# Patient Record
Sex: Female | Born: 2005 | Hispanic: No | Marital: Single | Smoking: Never smoker
Health system: Southern US, Community
[De-identification: ages and names within clinical notes are randomized; demographics above are authoritative.]

## PROBLEM LIST (undated history)

## (undated) ENCOUNTER — Inpatient Hospital Stay (HOSPITAL_COMMUNITY): Payer: Self-pay

## (undated) DIAGNOSIS — Z789 Other specified health status: Secondary | ICD-10-CM

## (undated) HISTORY — PX: NO PAST SURGERIES: SHX2092

## (undated) HISTORY — DX: Other specified health status: Z78.9

---
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
## 2023-03-24 DIAGNOSIS — F419 Anxiety disorder, unspecified: Secondary | ICD-10-CM | POA: Diagnosis not present

## 2023-04-16 DIAGNOSIS — F4310 Post-traumatic stress disorder, unspecified: Secondary | ICD-10-CM | POA: Diagnosis not present

## 2023-04-22 ENCOUNTER — Other Ambulatory Visit: Payer: Self-pay | Admitting: Adult Health

## 2023-04-22 ENCOUNTER — Encounter: Payer: Self-pay | Admitting: *Deleted

## 2023-04-22 ENCOUNTER — Ambulatory Visit (INDEPENDENT_AMBULATORY_CARE_PROVIDER_SITE_OTHER): Payer: Medicaid Other | Admitting: *Deleted

## 2023-04-22 VITALS — BP 129/76 | HR 92 | Ht 67.0 in | Wt 154.5 lb

## 2023-04-22 DIAGNOSIS — Z3201 Encounter for pregnancy test, result positive: Secondary | ICD-10-CM | POA: Diagnosis not present

## 2023-04-22 LAB — POCT URINE PREGNANCY: Preg Test, Ur: POSITIVE — AB

## 2023-04-22 MED ORDER — PRENATAL PLUS 27-1 MG PO TABS: 1.0000 | ORAL_TABLET | Freq: Every day | ORAL | 12 refills | Status: AC

## 2023-04-22 NOTE — Progress Notes (Signed)
   NURSE VISIT- PREGNANCY CONFIRMATION   SUBJECTIVE:  Zoe Carroll is a 17 y.o. G74P1001 female at [redacted]w[redacted]d by certain LMP of Patient's last menstrual period was 03/20/2023. Here for pregnancy confirmation.  Home pregnancy test: positive x 2.   She reports cramping.  She is not taking prenatal vitamins.    OBJECTIVE:  BP 129/76 (BP Location: Left Arm, Patient Position: Sitting, Cuff Size: Normal)   Pulse 92   Ht 5\' 7"  (1.702 m)   Wt 154 lb 8 oz (70.1 kg)   LMP 03/20/2023   Breastfeeding Yes   BMI 24.20 kg/m   Appears well, in no apparent distress  Results for orders placed or performed in visit on 04/22/23 (from the past 24 hours)  POCT urine pregnancy   Collection Time: 04/22/23  3:52 PM  Result Value Ref Range   Preg Test, Ur Positive (A) Negative    ASSESSMENT: Positive pregnancy test, [redacted]w[redacted]d by LMP    PLAN: Schedule for dating ultrasound in 4 weeks Prenatal vitamins: note routed to [facility] to send prescription   Nausea medicines: not currently needed   OB packet given: Yes  [name]  04/22/2023 3:57 PM

## 2023-04-22 NOTE — Progress Notes (Signed)
Rx PNV ?

## 2023-04-23 ENCOUNTER — Encounter: Payer: Self-pay | Admitting: Advanced Practice Midwife

## 2023-04-23 DIAGNOSIS — F419 Anxiety disorder, unspecified: Secondary | ICD-10-CM | POA: Diagnosis not present

## 2023-05-02 DIAGNOSIS — F4310 Post-traumatic stress disorder, unspecified: Secondary | ICD-10-CM | POA: Diagnosis not present

## 2023-05-07 NOTE — L&D Delivery Note (Signed)
 OB/GYN Faculty Practice Delivery Note  Zoe Carroll is a 18 y.o. G2P1001 s/p SVD at 107w6d. She was admitted for SOL.   ROM: 0h 72m with clear fluid GBS Status: --Hennie (07/24 1330)    Labor Progress: Initial SVE: 8/90/-2. She then progressed to complete.   Delivery Date/Time: 12/31/2023 @ 12:53 Delivery: Called to room and patient was complete and pushing. Head delivered LOA. No nuchal cord present. Shoulder and body delivered in usual fashion. Infant with spontaneous cry, placed on mother's abdomen, dried and stimulated. Cord clamped x 2 after 1-minute delay, and cut by FOB. Cord blood drawn. Placenta delivered spontaneously with gentle cord traction. Fundus firm with massage and Pitocin . Labia, perineum, vagina, and cervix inspected inspected with 1st degree perineal laceration identified. Also had bilateral labial abrasions which were hemostatic. .  Baby Weight: pending  Placenta: Sent to L&D Complications: None Lacerations: 1st degree perineal repaired with 4-0 vicryl EBL: 200 mL Analgesia: Epidural   Infant:  APGAR (1 MIN): 8  APGAR (5 MINS): 9

## 2023-05-09 DIAGNOSIS — F419 Anxiety disorder, unspecified: Secondary | ICD-10-CM | POA: Diagnosis not present

## 2023-05-16 ENCOUNTER — Other Ambulatory Visit: Payer: Self-pay | Admitting: Obstetrics & Gynecology

## 2023-05-16 DIAGNOSIS — O3680X0 Pregnancy with inconclusive fetal viability, not applicable or unspecified: Secondary | ICD-10-CM

## 2023-05-20 ENCOUNTER — Ambulatory Visit: Payer: Medicaid Other | Admitting: *Deleted

## 2023-05-20 ENCOUNTER — Ambulatory Visit: Payer: Medicaid Other

## 2023-05-20 DIAGNOSIS — Z3A08 8 weeks gestation of pregnancy: Secondary | ICD-10-CM

## 2023-05-20 DIAGNOSIS — Z3491 Encounter for supervision of normal pregnancy, unspecified, first trimester: Secondary | ICD-10-CM

## 2023-05-20 DIAGNOSIS — O3680X0 Pregnancy with inconclusive fetal viability, not applicable or unspecified: Secondary | ICD-10-CM

## 2023-05-21 ENCOUNTER — Encounter: Payer: Self-pay | Admitting: Radiology

## 2023-05-22 DIAGNOSIS — F419 Anxiety disorder, unspecified: Secondary | ICD-10-CM | POA: Diagnosis not present

## 2023-06-02 DIAGNOSIS — F419 Anxiety disorder, unspecified: Secondary | ICD-10-CM | POA: Diagnosis not present

## 2023-06-05 ENCOUNTER — Ambulatory Visit: Payer: Medicaid Other | Admitting: *Deleted

## 2023-06-05 ENCOUNTER — Encounter: Payer: Medicaid Other | Admitting: Advanced Practice Midwife

## 2023-06-06 ENCOUNTER — Encounter: Payer: Self-pay | Admitting: Women's Health

## 2023-06-06 DIAGNOSIS — Z3490 Encounter for supervision of normal pregnancy, unspecified, unspecified trimester: Secondary | ICD-10-CM | POA: Insufficient documentation

## 2023-06-10 DIAGNOSIS — F419 Anxiety disorder, unspecified: Secondary | ICD-10-CM | POA: Diagnosis not present

## 2023-06-11 ENCOUNTER — Encounter: Payer: Self-pay | Admitting: Women's Health

## 2023-06-11 ENCOUNTER — Ambulatory Visit (INDEPENDENT_AMBULATORY_CARE_PROVIDER_SITE_OTHER): Payer: Medicaid Other | Admitting: Women's Health

## 2023-06-11 ENCOUNTER — Ambulatory Visit: Payer: Medicaid Other | Admitting: *Deleted

## 2023-06-11 VITALS — BP 122/66 | HR 72 | Wt 154.0 lb

## 2023-06-11 DIAGNOSIS — Z3A11 11 weeks gestation of pregnancy: Secondary | ICD-10-CM | POA: Diagnosis not present

## 2023-06-11 DIAGNOSIS — Z3480 Encounter for supervision of other normal pregnancy, unspecified trimester: Secondary | ICD-10-CM

## 2023-06-11 DIAGNOSIS — Z1332 Encounter for screening for maternal depression: Secondary | ICD-10-CM | POA: Diagnosis not present

## 2023-06-11 DIAGNOSIS — Z131 Encounter for screening for diabetes mellitus: Secondary | ICD-10-CM

## 2023-06-11 DIAGNOSIS — Z3481 Encounter for supervision of other normal pregnancy, first trimester: Secondary | ICD-10-CM | POA: Diagnosis not present

## 2023-06-11 MED ORDER — BLOOD PRESSURE MONITOR MISC: 0 refills | Status: DC

## 2023-06-11 NOTE — Patient Instructions (Signed)
 Zoe Carroll, thank you for choosing our office today! We appreciate the opportunity to meet your healthcare needs. You may receive a short survey by mail, e-mail, or through [employer]. If you are happy with your care we would appreciate if you could take just a few minutes to complete the survey questions. We read all of your comments and take your feedback very seriously. Thank you again for choosing our office.  [facility] [employer] [facility] Team at [facility]  [facility] at [facility] ([address] [place], [place] [address]) Entrance C, located off of [address] Free 24/7 valet parking   Nausea & Vomiting Have saltine crackers or pretzels by your bed and eat a few bites before you raise your head out of bed in the morning Eat small frequent meals throughout the day instead of large meals Drink plenty of fluids throughout the day to stay hydrated, just don't drink a lot of fluids with your meals.  This can make your stomach fill up faster making you feel sick Do not brush your teeth right after you eat Products with real ginger are good for nausea, like ginger ale and ginger hard candy Make sure it says made with real ginger! Sucking on sour candy like lemon heads is also good for nausea If your prenatal vitamins make you nauseated, take them at night so you will sleep through the nausea Sea Bands If you feel like you need medicine for the nausea & vomiting please let us  know If you are unable to keep any fluids or food down please let us  know   Constipation Drink plenty of fluid, preferably water, throughout the day Eat foods high in fiber such as fruits, vegetables, and grains Exercise, such as walking, is a good way to keep your bowels regular Drink warm fluids, especially warm prune juice, or decaf coffee Eat a 1/2 cup of real oatmeal (not instant), 1/2 cup applesauce, and 1/2-1 cup warm prune juice every day If needed, you may take Colace (docusate sodium ) stool softener  once or twice a day to help keep the stool soft.  If you still are having problems with constipation, you may take Miralax once daily as needed to help keep your bowels regular.   Home Blood Pressure Monitoring for Patients   Your provider has recommended that you check your blood pressure (BP) at least once a week at home. If you do not have a blood pressure cuff at home, one will be provided for you. Contact your provider if you have not received your monitor within 1 week.   Helpful Tips for Accurate Home Blood Pressure Checks  Don't smoke, exercise, or drink caffeine 30 minutes before checking your BP Use the restroom before checking your BP (a full bladder can raise your pressure) Relax in a comfortable upright chair Feet on the ground Left arm resting comfortably on a flat surface at the level of your heart Legs uncrossed Back supported Sit quietly and don't talk Place the cuff on your bare arm Adjust snuggly, so that only two fingertips can fit between your skin and the top of the cuff Check 2 readings separated by at least one minute Keep a log of your BP readings For a visual, please reference this diagram: http://ccnc.care/bpdiagram  Provider Name: [facility]     Phone: [phone]  Zone 1: ALL CLEAR  Continue to monitor your symptoms:  BP reading is less than 140 (top number) or less than 90 (bottom  number)  No right upper stomach pain No headaches or seeing spots No feeling nauseated or throwing up No swelling in face and hands  Zone 2: CAUTION Call your doctor's office for any of the following:  BP reading is greater than 140 (top number) or greater than 90 (bottom number)  Stomach pain under your ribs in the middle or right side Headaches or seeing spots Feeling nauseated or throwing up Swelling in face and hands  Zone 3: EMERGENCY  Seek immediate medical care if you have any of the following:  BP reading is greater than160 (top number) or greater than  110 (bottom number) Severe headaches not improving with Tylenol  Serious difficulty catching your breath Any worsening symptoms from Zone 2    First Trimester of Pregnancy The first trimester of pregnancy is from week 1 until the end of week 12 (months 1 through 3). A week after a sperm fertilizes an egg, the egg will implant on the wall of the uterus. This embryo will begin to develop into a baby. Genes from you and your partner are forming the baby. The female genes determine whether the baby is a boy or a girl. At 6-8 weeks, the eyes and face are formed, and the heartbeat can be seen on ultrasound. At the end of 12 weeks, all the baby's organs are formed.  Now that you are pregnant, you will want to do everything you can to have a healthy baby. Two of the most important things are to get good prenatal care and to follow your health care provider's instructions. Prenatal care is all the medical care you receive before the baby's birth. This care will help prevent, find, and treat any problems during the pregnancy and childbirth. BODY CHANGES Your body goes through many changes during pregnancy. The changes vary from woman to woman.  You may gain or lose a couple of pounds at first. You may feel sick to your stomach (nauseous) and throw up (vomit). If the vomiting is uncontrollable, call your health care provider. You may tire easily. You may develop headaches that can be relieved by medicines approved by your health care provider. You may urinate more often. Painful urination may mean you have a bladder infection. You may develop heartburn as a result of your pregnancy. You may develop constipation because certain hormones are causing the muscles that push waste through your intestines to slow down. You may develop hemorrhoids or swollen, bulging veins (varicose veins). Your breasts may begin to grow larger and become tender. Your nipples may stick out more, and the tissue that surrounds them  (areola) may become darker. Your gums may bleed and may be sensitive to brushing and flossing. Dark spots or blotches (chloasma, mask of pregnancy) may develop on your face. This will likely fade after the baby is born. Your menstrual periods will stop. You may have a loss of appetite. You may develop cravings for certain kinds of food. You may have changes in your emotions from day to day, such as being excited to be pregnant or being concerned that something may go wrong with the pregnancy and baby. You may have more vivid and strange dreams. You may have changes in your hair. These can include thickening of your hair, rapid growth, and changes in texture. Some women also have hair loss during or after pregnancy, or hair that feels dry or thin. Your hair will most likely return to normal after your baby is born. WHAT TO EXPECT AT YOUR PRENATAL  VISITS During a routine prenatal visit: You will be weighed to make sure you and the baby are growing normally. Your blood pressure will be taken. Your abdomen will be measured to track your baby's growth. The fetal heartbeat will be listened to starting around week 10 or 12 of your pregnancy. Test results from any previous visits will be discussed. Your health care provider may ask you: How you are feeling. If you are feeling the baby move. If you have had any abnormal symptoms, such as leaking fluid, bleeding, severe headaches, or abdominal cramping. If you have any questions. Other tests that may be performed during your first trimester include: Blood tests to find your blood type and to check for the presence of any previous infections. They will also be used to check for low iron levels (anemia) and Rh antibodies. Later in the pregnancy, blood tests for diabetes will be done along with other tests if problems develop. Urine tests to check for infections, diabetes, or protein in the urine. An ultrasound to confirm the proper growth and development  of the baby. An amniocentesis to check for possible genetic problems. Fetal screens for spina bifida and Down syndrome. You may need other tests to make sure you and the baby are doing well. HOME CARE INSTRUCTIONS  Medicines Follow your health care provider's instructions regarding medicine use. Specific medicines may be either safe or unsafe to take during pregnancy. Take your prenatal vitamins as directed. If you develop constipation, try taking a stool softener if your health care provider approves. Diet Eat regular, well-balanced meals. Choose a variety of foods, such as meat or vegetable-based protein, fish, milk and low-fat dairy products, vegetables, fruits, and whole grain breads and cereals. Your health care provider will help you determine the amount of weight gain that is right for you. Avoid raw meat and uncooked cheese. These carry germs that can cause birth defects in the baby. Eating four or five small meals rather than three large meals a day may help relieve nausea and vomiting. If you start to feel nauseous, eating a few soda crackers can be helpful. Drinking liquids between meals instead of during meals also seems to help nausea and vomiting. If you develop constipation, eat more high-fiber foods, such as fresh vegetables or fruit and whole grains. Drink enough fluids to keep your urine clear or pale yellow. Activity and Exercise Exercise only as directed by your health care provider. Exercising will help you: Control your weight. Stay in shape. Be prepared for labor and delivery. Experiencing pain or cramping in the lower abdomen or low back is a good sign that you should stop exercising. Check with your health care provider before continuing normal exercises. Try to avoid standing for long periods of time. Move your legs often if you must stand in one place for a long time. Avoid heavy lifting. Wear low-heeled shoes, and practice good posture. You may continue to have sex  unless your health care provider directs you otherwise. Relief of Pain or Discomfort Wear a good support bra for breast tenderness.   Take warm sitz baths to soothe any pain or discomfort caused by hemorrhoids. Use hemorrhoid cream if your health care provider approves.   Rest with your legs elevated if you have leg cramps or low back pain. If you develop varicose veins in your legs, wear support hose. Elevate your feet for 15 minutes, 3-4 times a day. Limit salt in your diet. Prenatal Care Schedule your prenatal visits by the  twelfth week of pregnancy. They are usually scheduled monthly at first, then more often in the last 2 months before delivery. Write down your questions. Take them to your prenatal visits. Keep all your prenatal visits as directed by your health care provider. Safety Wear your seat belt at all times when driving. Make a list of emergency phone numbers, including numbers for family, friends, the hospital, and police and fire departments. General Tips Ask your health care provider for a referral to a local prenatal education class. Begin classes no later than at the beginning of month 6 of your pregnancy. Ask for help if you have counseling or nutritional needs during pregnancy. Your health care provider can offer advice or refer you to specialists for help with various needs. Do not use hot tubs, steam rooms, or saunas. Do not douche or use tampons or scented sanitary pads. Do not cross your legs for long periods of time. Avoid cat litter boxes and soil used by cats. These carry germs that can cause birth defects in the baby and possibly loss of the fetus by miscarriage or stillbirth. Avoid all smoking, herbs, alcohol, and medicines not prescribed by your health care provider. Chemicals in these affect the formation and growth of the baby. Schedule a dentist appointment. At home, brush your teeth with a soft toothbrush and be gentle when you floss. SEEK MEDICAL CARE IF:   You have dizziness. You have mild pelvic cramps, pelvic pressure, or nagging pain in the abdominal area. You have persistent nausea, vomiting, or diarrhea. You have a bad smelling vaginal discharge. You have pain with urination. You notice increased swelling in your face, hands, legs, or ankles. SEEK IMMEDIATE MEDICAL CARE IF:  You have a fever. You are leaking fluid from your vagina. You have spotting or bleeding from your vagina. You have severe abdominal cramping or pain. You have rapid weight gain or loss. You vomit blood or material that looks like coffee grounds. You are exposed to German measles and have never had them. You are exposed to fifth disease or chickenpox. You develop a severe headache. You have shortness of breath. You have any kind of trauma, such as from a fall or a car accident. Document Released: 04/16/2001 Document Revised: 09/06/2013 Document Reviewed: 03/02/2013 [employer] Patient Information 2015 [place], [place]. This information is not intended to replace advice given to you by your health care provider. Make sure you discuss any questions you have with your health care provider.

## 2023-06-11 NOTE — Progress Notes (Signed)
 INITIAL OBSTETRICAL VISIT Patient name: Zoe Carroll MRN [id number]  Date of birth: Apr 11, 2006 Chief Complaint:   Initial Prenatal Visit (Spots on stomach getting bigger since pregnant)  History of Present Illness:   Zoe Carroll is a 18 y.o. G19P1001 Caucasian female at [redacted]w[redacted]d by LMP c/w u/s at 8 weeks with an Estimated Date of Delivery: 12/25/23 being seen today for her initial obstetrical visit.   Patient's last menstrual period was 03/20/2023. Her obstetrical history is significant for  term uncomplicated SVB x 1 .   Today she reports  spots on stomach getting bigger. Has had biggest one for 3 years. Don't itch.  .  Last pap <21yo. Results were: N/A     06/11/2023    3:32 PM 11/01/2021    4:23 PM  Depression screen PHQ 2/9  Decreased Interest 0 0  Down, Depressed, Hopeless 0 0  PHQ - 2 Score 0 0  Altered sleeping 0 0  Tired, decreased energy 1 1  Change in appetite 1 0  Feeling bad or failure about yourself  0 0  Trouble concentrating 0 0  Moving slowly or fidgety/restless 0 0  Suicidal thoughts 0 0  PHQ-9 Score 2 1        06/11/2023    3:32 PM 11/01/2021    4:23 PM  GAD 7 : Generalized Anxiety Score  Nervous, Anxious, on Edge 0 0  Control/stop worrying 0 0  Worry too much - different things 0 0  Trouble relaxing 0 0  Restless 0 0  Easily annoyed or irritable 0 0  Afraid - awful might happen 0 0  Total GAD 7 Score 0 0     Review of Systems:   Pertinent items are noted in HPI Denies cramping/contractions, leakage of fluid, vaginal bleeding, abnormal vaginal discharge w/ itching/odor/irritation, headaches, visual changes, shortness of breath, chest pain, abdominal pain, severe nausea/vomiting, or problems with urination or bowel movements unless otherwise stated above.  Pertinent History Reviewed:  Reviewed past medical,surgical, social, obstetrical and family history.  Reviewed problem list, medications and allergies. OB History  Gravida Para Term Preterm AB  Living  2 1 1   1   SAB IAB Ectopic Multiple Live Births     0 1    # Outcome Date GA Lbr Len/2nd Weight Sex Type Anes PTL Lv  2 Current           1 Term 12/01/21 [redacted]w[redacted]d 20:55 / 01:20 8 lb 6.4 oz (3.81 kg) M Vag-Spont EPI N LIV   Physical Assessment:   Vitals:   06/11/23 1512  BP: 122/66  Pulse: 72  Weight: 154 lb (69.9 kg)  Body mass index is 24.12 kg/m.       Physical Examination:  General appearance - well appearing, and in no distress  Mental status - alert, oriented to person, place, and time  Psych:  She has a normal mood and affect  Skin - warm and dry, normal color, no suspicious lesions noted; few flat darker circles upper abdomen  Chest - effort normal, all lung fields clear to auscultation bilaterally  Heart - normal rate and regular rhythm  Abdomen - soft, nontender  Extremities:  No swelling or varicosities noted  Thin prep pap is not done   Chaperone: N/A    Informal TA u/s: +FCA and active fetus  No results found for this or any previous visit (from the past 24 hours).  Assessment & Plan:  1) Low-Risk Pregnancy G2P1001 at  [redacted]w[redacted]d with an Estimated Date of Delivery: 12/25/23   2) Initial OB visit  3) Darker circles upper abd> c/w hormonal changes, no itching  Meds:  Meds ordered this encounter  Medications   Blood Pressure Monitor MISC    Sig: For regular home bp monitoring during pregnancy    Dispense:  1 each    Refill:  0    Z34.81 Please mail to patient    Initial labs obtained Continue prenatal vitamins Reviewed n/v relief measures and warning s/s to report Reviewed recommended weight gain based on pre-gravid BMI Encouraged well-balanced diet Genetic & carrier screening discussed: declines Panorama, NT/IT, AFP, and Horizon  Ultrasound discussed; fetal survey: requested [facility]NC completed> form faxed if has or is planning to apply for medicaid The nature of [employer] for [employer] with multiple MDs and other Advanced Practice  Providers was explained to patient; also emphasized that fellows, residents, and students are part of our team. Does not have home bp cuff. Office bp cuff given: no. Rx sent: yes. Check bp weekly, let us  know if consistently >140/90.  Declined flu shot  Follow-up: Return in about 4 weeks (around 07/09/2023) for LROB, CNM, in person; then 8wks from now anatomy u/s and LROB w/ CNM.   Orders Placed This Encounter  Procedures   Urine Culture   GC/Chlamydia Probe Amp   Hemoglobin A1c   CBC/D/Plt+RPR+Rh+ABO+RubIgG...    Suzen JONELLE Fetters CNM, [facility] 06/11/2023 3:54 PM

## 2023-06-12 LAB — CBC/D/PLT+RPR+RH+ABO+RUBIGG...
Antibody Screen: NEGATIVE
Basophils Absolute: 0 10*3/uL (ref 0.0–0.2)
Basos: 0 %
EOS (ABSOLUTE): 0.3 10*3/uL (ref 0.0–0.4)
Eos: 3 %
HCV Ab: NONREACTIVE
HIV Screen 4th Generation wRfx: NONREACTIVE
Hematocrit: 43.4 % (ref 34.0–46.6)
Hemoglobin: 14.4 g/dL (ref 11.1–15.9)
Hepatitis B Surface Ag: NEGATIVE
Immature Grans (Abs): 0 10*3/uL (ref 0.0–0.1)
Immature Granulocytes: 0 %
Lymphocytes Absolute: 1.9 10*3/uL (ref 0.7–3.1)
Lymphs: 20 %
MCH: 28.3 pg (ref 26.6–33.0)
MCHC: 33.2 g/dL (ref 31.5–35.7)
MCV: 85 fL (ref 79–97)
Monocytes Absolute: 0.4 10*3/uL (ref 0.1–0.9)
Monocytes: 4 %
Neutrophils Absolute: 7 10*3/uL (ref 1.4–7.0)
Neutrophils: 73 %
Platelets: 277 10*3/uL (ref 150–450)
RBC: 5.08 x10E6/uL (ref 3.77–5.28)
RDW: 12.4 % (ref 11.7–15.4)
RPR Ser Ql: NONREACTIVE
Rh Factor: POSITIVE
Rubella Antibodies, IGG: 3.68 {index} (ref >0.99)
WBC: 9.6 10*3/uL (ref 3.4–10.8)

## 2023-06-12 LAB — HEMOGLOBIN A1C
Est. average glucose Bld gHb Est-mCnc: 97 mg/dL
Hgb A1c MFr Bld: 5 % (ref 4.8–5.6)

## 2023-06-12 LAB — HCV INTERPRETATION

## 2023-06-13 LAB — URINE CULTURE

## 2023-06-15 LAB — GC/CHLAMYDIA PROBE AMP
Chlamydia trachomatis, NAA: NEGATIVE
Neisseria Gonorrhoeae by PCR: NEGATIVE

## 2023-07-09 ENCOUNTER — Encounter: Payer: Self-pay | Admitting: Advanced Practice Midwife

## 2023-07-09 ENCOUNTER — Ambulatory Visit (INDEPENDENT_AMBULATORY_CARE_PROVIDER_SITE_OTHER): Payer: Medicaid Other | Admitting: Advanced Practice Midwife

## 2023-07-09 VITALS — BP 126/71 | HR 97 | Wt 155.4 lb

## 2023-07-09 DIAGNOSIS — Z3A15 15 weeks gestation of pregnancy: Secondary | ICD-10-CM

## 2023-07-09 DIAGNOSIS — Z3480 Encounter for supervision of other normal pregnancy, unspecified trimester: Secondary | ICD-10-CM | POA: Diagnosis not present

## 2023-07-09 NOTE — Patient Instructions (Signed)
 [place], thank you for choosing our office today! We appreciate the opportunity to meet your healthcare needs. You may receive a short survey by mail, e-mail, or through [employer]. If you are happy with your care we would appreciate if you could take just a few minutes to complete the survey questions. We read all of your comments and take your feedback very seriously. Thank you again for choosing our office.  Center for [employer] Team at [facility] at [facility] ([address] [place], [place] [address]) Entrance C, located off of [address] Free 24/7 valet parking  Go to [email or web address] to register for FREE online childbirth classes  Call the office [phone]) or go to [facility] if: You begin to severe cramping Your water breaks.  Sometimes it is a big gush of fluid, sometimes it is just a trickle that keeps getting your panties wet or running down your legs You have vaginal bleeding.  It is normal to have a small amount of spotting if your cervix was checked.   [facility] Pediatricians/Family Doctors [facility]): [address] Colette Ribas, [phone]           [facility]: [address], [phone]                [facility]): [address], [phone] (call to ask if accepting patients) [facility] Department: [address], [place], [phone]    [facility] Pediatricians/Family Doctors [facility]): [phone] [address], [phone] [facility]: [address] [place], [phone] [facility]: [address], [phone]  [facility] Doctors  [facility]): [phone] [facility]: [address] [phone]   [facility] Doctors [facility]: [address] [phone]  [facility] Doctors  [facility]: [phone], [phone]  Home Blood Pressure Monitoring for Patients   Your provider has recommended that you check your blood pressure (BP) at least once a week at home. If you do not have a blood pressure cuff at home, one will be provided for you. Contact your provider if you have not received your monitor within 1 week.   Helpful Tips for Accurate Home Blood Pressure Checks  Don't smoke, exercise, or drink caffeine 30 minutes before checking your BP Use the restroom before checking your BP (a full bladder can raise your pressure) Relax in a comfortable upright chair Feet on the ground Left arm resting comfortably on a flat surface at the level of your heart Legs uncrossed Back supported Sit quietly and don't talk Place the cuff on your bare arm Adjust snuggly, so that only two fingertips can fit between your skin and the top of the cuff Check 2 readings separated by at least one minute Keep a log of your BP readings For a visual, please reference this diagram: http://ccnc.care/bpdiagram  Provider Name: [facility]     Phone: [phone]  Zone 1: ALL CLEAR  Continue to monitor your symptoms:  BP reading is less than 140 (top number) or less than 90 (bottom number)  No right upper stomach pain No headaches or seeing spots No feeling nauseated or throwing up No swelling in face and hands  Zone 2: CAUTION Call your doctor's office for any of the following:  BP reading is greater than 140 (top number) or greater than  90 (bottom number)  Stomach pain under your ribs in the middle or right side Headaches or seeing spots Feeling nauseated or throwing up Swelling in face and hands  Zone 3: EMERGENCY  Seek immediate medical care if you have any of the following:  BP reading is greater than160 (top number) or greater than 110 (bottom number) Severe headaches not improving with Tylenol Serious difficulty catching your breath Any worsening symptoms from  Zone 2     Second Trimester of Pregnancy The second trimester is from week 14 through week 27 (months 4 through 6). The second trimester is often a time when you feel your best. Your body has adjusted to being pregnant, and you begin to feel better physically. Usually, morning sickness has lessened or quit completely, you may have more energy, and you may have an increase in appetite. The second trimester is also a time when the fetus is growing rapidly. At the end of the sixth month, the fetus is about 9 inches long and weighs about 1 pounds. You will likely begin to feel the baby move (quickening) between 16 and 20 weeks of pregnancy. Body changes during your second trimester Your body continues to go through many changes during your second trimester. The changes vary from woman to woman. Your weight will continue to increase. You will notice your lower abdomen bulging out. You may begin to get stretch marks on your hips, abdomen, and breasts. You may develop headaches that can be relieved by medicines. The medicines should be approved by your health care provider. You may urinate more often because the fetus is pressing on your bladder. You may develop or continue to have heartburn as a result of your pregnancy. You may develop constipation because certain hormones are causing the muscles that push waste through your intestines to slow down. You may develop hemorrhoids or swollen, bulging veins (varicose veins). You may have back pain. This is caused by: Weight gain. Pregnancy hormones that are relaxing the joints in your pelvis. A shift in weight and the muscles that support your balance. Your breasts will continue to grow and they will continue to become tender. Your gums may bleed and may be sensitive to brushing and flossing. Dark spots or blotches (chloasma, mask of pregnancy) may develop on your face. This will likely fade after the baby is born. A dark line from your belly button to  the pubic area (linea nigra) may appear. This will likely fade after the baby is born. You may have changes in your hair. These can include thickening of your hair, rapid growth, and changes in texture. Some women also have hair loss during or after pregnancy, or hair that feels dry or thin. Your hair will most likely return to normal after your baby is born.  What to expect at prenatal visits During a routine prenatal visit: You will be weighed to make sure you and the fetus are growing normally. Your blood pressure will be taken. Your abdomen will be measured to track your baby's growth. The fetal heartbeat will be listened to. Any test results from the previous visit will be discussed.  Your health care provider may ask you: How you are feeling. If you are feeling the baby move. If you have had any abnormal symptoms, such as leaking fluid, bleeding, severe headaches, or abdominal cramping. If you are using any tobacco products, including cigarettes, chewing tobacco, and electronic cigarettes. If you have any questions.  Other tests that may be performed during  your second trimester include: Blood tests that check for: Low iron levels (anemia). High blood sugar that affects pregnant women (gestational diabetes) between 12 and 28 weeks. Rh antibodies. This is to check for a protein on red blood cells (Rh factor). Urine tests to check for infections, diabetes, or protein in the urine. An ultrasound to confirm the proper growth and development of the baby. An amniocentesis to check for possible genetic problems. Fetal screens for spina bifida and Down syndrome. HIV (human immunodeficiency virus) testing. Routine prenatal testing includes screening for HIV, unless you choose not to have this test.  Follow these instructions at home: Medicines Follow your health care provider's instructions regarding medicine use. Specific medicines may be either safe or unsafe to take during  pregnancy. Take a prenatal vitamin that contains at least 600 micrograms (mcg) of folic acid. If you develop constipation, try taking a stool softener if your health care provider approves. Eating and drinking Eat a balanced diet that includes fresh fruits and vegetables, whole grains, good sources of protein such as meat, eggs, or tofu, and low-fat dairy. Your health care provider will help you determine the amount of weight gain that is right for you. Avoid raw meat and uncooked cheese. These carry germs that can cause birth defects in the baby. If you have low calcium intake from food, talk to your health care provider about whether you should take a daily calcium supplement. Limit foods that are high in fat and processed sugars, such as fried and sweet foods. To prevent constipation: Drink enough fluid to keep your urine clear or pale yellow. Eat foods that are high in fiber, such as fresh fruits and vegetables, whole grains, and beans. Activity Exercise only as directed by your health care provider. Most women can continue their usual exercise routine during pregnancy. Try to exercise for 30 minutes at least 5 days a week. Stop exercising if you experience uterine contractions. Avoid heavy lifting, wear low heel shoes, and practice good posture. A sexual relationship may be continued unless your health care provider directs you otherwise. Relieving pain and discomfort Wear a good support bra to prevent discomfort from breast tenderness. Take warm sitz baths to soothe any pain or discomfort caused by hemorrhoids. Use hemorrhoid cream if your health care provider approves. Rest with your legs elevated if you have leg cramps or low back pain. If you develop varicose veins, wear support hose. Elevate your feet for 15 minutes, 3-4 times a day. Limit salt in your diet. Prenatal Care Write down your questions. Take them to your prenatal visits. Keep all your prenatal visits as told by your health  care provider. This is important. Safety Wear your seat belt at all times when driving. Make a list of emergency phone numbers, including numbers for family, friends, the hospital, and police and fire departments. General instructions Ask your health care provider for a referral to a local prenatal education class. Begin classes no later than the beginning of month 6 of your pregnancy. Ask for help if you have counseling or nutritional needs during pregnancy. Your health care provider can offer advice or refer you to specialists for help with various needs. Do not use hot tubs, steam rooms, or saunas. Do not douche or use tampons or scented sanitary pads. Do not cross your legs for long periods of time. Avoid cat litter boxes and soil used by cats. These carry germs that can cause birth defects in the baby and possibly loss of the  fetus by miscarriage or stillbirth. Avoid all smoking, herbs, alcohol, and unprescribed drugs. Chemicals in these products can affect the formation and growth of the baby. Do not use any products that contain nicotine or tobacco, such as cigarettes and e-cigarettes. If you need help quitting, ask your health care provider. Visit your dentist if you have not gone yet during your pregnancy. Use a soft toothbrush to brush your teeth and be gentle when you floss. Contact a health care provider if: You have dizziness. You have mild pelvic cramps, pelvic pressure, or nagging pain in the abdominal area. You have persistent nausea, vomiting, or diarrhea. You have a bad smelling vaginal discharge. You have pain when you urinate. Get help right away if: You have a fever. You are leaking fluid from your vagina. You have spotting or bleeding from your vagina. You have severe abdominal cramping or pain. You have rapid weight gain or weight loss. You have shortness of breath with chest pain. You notice sudden or extreme swelling of your face, hands, ankles, feet, or legs. You  have not felt your baby move in over an hour. You have severe headaches that do not go away when you take medicine. You have vision changes. Summary The second trimester is from week 14 through week 27 (months 4 through 6). It is also a time when the fetus is growing rapidly. Your body goes through many changes during pregnancy. The changes vary from woman to woman. Avoid all smoking, herbs, alcohol, and unprescribed drugs. These chemicals affect the formation and growth your baby. Do not use any tobacco products, such as cigarettes, chewing tobacco, and e-cigarettes. If you need help quitting, ask your health care provider. Contact your health care provider if you have any questions. Keep all prenatal visits as told by your health care provider. This is important. This information is not intended to replace advice given to you by your health care provider. Make sure you discuss any questions you have with your health care provider. Document Released: 04/16/2001 Document Revised: 09/28/2015 Document Reviewed: 06/23/2012 Elsevier Interactive Patient Education  2017 [employer].

## 2023-07-09 NOTE — Progress Notes (Signed)
   LOW-RISK PREGNANCY VISIT Patient name: Zoe Carroll MRN [id number]  Date of birth: 07/09/2005 Chief Complaint:   Routine Prenatal Visit  History of Present Illness:   Zoe Carroll is a 18 y.o. G55P1001 female at [redacted]w[redacted]d with an Estimated Date of Delivery: 12/25/23 being seen today for ongoing management of a low-risk pregnancy.  Today she reports nausea. Contractions: Not present.  .  Movement: Absent. denies leaking of fluid. Review of Systems:   Pertinent items are noted in HPI Denies abnormal vaginal discharge w/ itching/odor/irritation, headaches, visual changes, shortness of breath, chest pain, abdominal pain, severe nausea/vomiting, or problems with urination or bowel movements unless otherwise stated above. Pertinent History Reviewed:  Reviewed past medical,surgical, social, obstetrical and family history.  Reviewed problem list, medications and allergies. Physical Assessment:   Vitals:   07/09/23 1537  BP: 126/71  Pulse: 97  Weight: 155 lb 6.4 oz (70.5 kg)  Body mass index is 24.34 kg/m.        Physical Examination:   General appearance: Well appearing, and in no distress  Mental status: Alert, oriented to person, place, and time  Skin: Warm & dry  Cardiovascular: Normal heart rate noted  Respiratory: Normal respiratory effort, no distress  Abdomen: Soft, gravid, nontender  Pelvic: Cervical exam deferred         Extremities: Edema: None  Fetal Status: Fetal Heart Rate (bpm): 155   Movement: Absent    No results found for this or any previous visit (from the past 24 hours).  Assessment & Plan:  1) Low-risk pregnancy G2P1001 at [redacted]w[redacted]d with an Estimated Date of Delivery: 12/25/23    Meds: No orders of the defined types were placed in this encounter.  Labs/procedures today: none  Plan:  Continue routine obstetrical care   Reviewed: Preterm labor symptoms and general obstetric precautions including but not limited to vaginal bleeding, contractions, leaking of fluid  and fetal movement were reviewed in detail with the patient.  All questions were answered. Hasn't check mail yet for home bp cuff. Check bp weekly, let us know if >140/90.   Follow-up: Return for As scheduled. (Anatomy u/s)  No orders of the defined types were placed in this encounter.  [name] CNM 07/09/2023 3:50 PM

## 2023-08-05 ENCOUNTER — Other Ambulatory Visit: Payer: Self-pay | Admitting: Obstetrics & Gynecology

## 2023-08-05 DIAGNOSIS — Z363 Encounter for antenatal screening for malformations: Secondary | ICD-10-CM

## 2023-08-06 ENCOUNTER — Ambulatory Visit: Payer: Medicaid Other | Admitting: Women's Health

## 2023-08-06 ENCOUNTER — Ambulatory Visit: Payer: Medicaid Other

## 2023-08-06 ENCOUNTER — Encounter: Payer: Self-pay | Admitting: Women's Health

## 2023-08-06 VITALS — BP 113/68 | HR 86 | Wt 159.0 lb

## 2023-08-06 DIAGNOSIS — Z3A19 19 weeks gestation of pregnancy: Secondary | ICD-10-CM

## 2023-08-06 DIAGNOSIS — Z3482 Encounter for supervision of other normal pregnancy, second trimester: Secondary | ICD-10-CM | POA: Diagnosis not present

## 2023-08-06 DIAGNOSIS — Z363 Encounter for antenatal screening for malformations: Secondary | ICD-10-CM | POA: Diagnosis not present

## 2023-08-06 DIAGNOSIS — Z3480 Encounter for supervision of other normal pregnancy, unspecified trimester: Secondary | ICD-10-CM

## 2023-08-06 NOTE — Progress Notes (Signed)
 LOW-RISK PREGNANCY VISIT Patient name: Zoe Carroll MRN [id number]  Date of birth: 12-14-2005 Chief Complaint:   Routine Prenatal Visit (Anatomy scan)  History of Present Illness:   ASHLAN DIGNAN is a 18 y.o. G85P1001 female at [redacted]w[redacted]d with an Estimated Date of Delivery: 12/25/23 being seen today for ongoing management of a low-risk pregnancy.   Today she reports no complaints. Contractions: Not present. Vag. Bleeding: None.  Movement: Present. denies leaking of fluid.     06/11/2023    3:32 PM 11/01/2021    4:23 PM  Depression screen PHQ 2/9  Decreased Interest 0 0  Down, Depressed, Hopeless 0 0  PHQ - 2 Score 0 0  Altered sleeping 0 0  Tired, decreased energy 1 1  Change in appetite 1 0  Feeling bad or failure about yourself  0 0  Trouble concentrating 0 0  Moving slowly or fidgety/restless 0 0  Suicidal thoughts 0 0  PHQ-9 Score 2 1        06/11/2023    3:32 PM 11/01/2021    4:23 PM  GAD 7 : Generalized Anxiety Score  Nervous, Anxious, on Edge 0 0  Control/stop worrying 0 0  Worry too much - different things 0 0  Trouble relaxing 0 0  Restless 0 0  Easily annoyed or irritable 0 0  Afraid - awful might happen 0 0  Total GAD 7 Score 0 0      Review of Systems:   Pertinent items are noted in HPI Denies abnormal vaginal discharge w/ itching/odor/irritation, headaches, visual changes, shortness of breath, chest pain, abdominal pain, severe nausea/vomiting, or problems with urination or bowel movements unless otherwise stated above. Pertinent History Reviewed:  Reviewed past medical,surgical, social, obstetrical and family history.  Reviewed problem list, medications and allergies. Physical Assessment:   Vitals:   08/06/23 1557  BP: 113/68  Pulse: 86  Weight: 159 lb (72.1 kg)  Body mass index is 24.9 kg/m.        Physical Examination:   General appearance: Well appearing, and in no distress  Mental status: Alert, oriented to person, place, and time  Skin: Warm  & dry  Cardiovascular: Normal heart rate noted  Respiratory: Normal respiratory effort, no distress  Abdomen: Soft, gravid, nontender  Pelvic: Cervical exam deferred         Extremities: Edema: None  Fetal Status:     Movement: Present  [place] 19+6 wks,cephalic,CX 3.6 cm,SVP of fluid 5.4 cm,posterior fundal placenta gr 0,normal ovaries,FHR 133 bpm,EFW 302 g 31%,anatomy complete,no obvious abnormalities   Chaperone: N/A No results found for this or any previous visit (from the past 24 hours).  Assessment & Plan:  1) Low-risk pregnancy G2P1001 at [redacted]w[redacted]d with an Estimated Date of Delivery: 12/25/23    Meds: No orders of the defined types were placed in this encounter.  Labs/procedures today: U/S  Plan:  Continue routine obstetrical care  Next visit: prefers in person    Reviewed: Preterm labor symptoms and general obstetric precautions including but not limited to vaginal bleeding, contractions, leaking of fluid and fetal movement were reviewed in detail with the patient.  All questions were answered. Does have home bp cuff. Office bp cuff given: not applicable. Check bp weekly, let us know if consistently >140 and/or >90.  Follow-up: Return in about 4 weeks (around 09/03/2023) for LROB, CNM, in person.  Future Appointments  Date Time Provider Department Center  09/03/2023  4:10 PM [name], CNM [facility]  [facility]    No orders of the defined types were placed in this encounter.  Cheral Marker CNM, [facility] 08/06/2023 4:11 PM

## 2023-08-06 NOTE — Progress Notes (Signed)
 [place] 19+6 wks,cephalic,CX 3.6 cm,SVP of fluid 5.4 cm,posterior fundal placenta gr 0,normal ovaries,FHR 133 bpm,EFW 302 g 31%,anatomy complete,no obvious abnormalities

## 2023-08-06 NOTE — Patient Instructions (Signed)
 Zoe Carroll, thank you for choosing our office today! We appreciate the opportunity to meet your healthcare needs. You may receive a short survey by mail, e-mail, or through [employer]. If you are happy with your care we would appreciate if you could take just a few minutes to complete the survey questions. We read all of your comments and take your feedback very seriously. Thank you again for choosing our office.  Center for [employer] Team at [facility] at [facility] ([address] [place], [place] [address]) Entrance C, located off of [address] Free 24/7 valet parking  Go to [email or web address] to register for FREE online childbirth classes  Call the office [phone]) or go to [facility] if: You begin to severe cramping Your water breaks.  Sometimes it is a big gush of fluid, sometimes it is just a trickle that keeps getting your panties wet or running down your legs You have vaginal bleeding.  It is normal to have a small amount of spotting if your cervix was checked.   [facility] Pediatricians/Family Doctors [facility]): [address] Colette Ribas, [phone]           [facility]: [address], [phone]                [facility]): [address], [phone] (call to ask if accepting patients) [facility] Department: [address], [place], [phone]    [facility] Pediatricians/Family Doctors [facility]): [phone] [address], [phone] [facility]: [address] [place], [phone] [facility]: [address], [phone]  [facility] Doctors  [facility]): [phone] [facility]: [address] [phone]   [facility] Doctors [facility]: [address] [phone]  [facility] Doctors  [facility]: [phone], [phone]  Home Blood Pressure Monitoring for Patients   Your provider has recommended that you check your blood pressure (BP) at least once a week at home. If you do not have a blood pressure cuff at home, one will be provided for you. Contact your provider if you have not received your monitor within 1 week.   Helpful Tips for Accurate Home Blood Pressure Checks  Don't smoke, exercise, or drink caffeine 30 minutes before checking your BP Use the restroom before checking your BP (a full bladder can raise your pressure) Relax in a comfortable upright chair Feet on the ground Left arm resting comfortably on a flat surface at the level of your heart Legs uncrossed Back supported Sit quietly and don't talk Place the cuff on your bare arm Adjust snuggly, so that only two fingertips can fit between your skin and the top of the cuff Check 2 readings separated by at least one minute Keep a log of your BP readings For a visual, please reference this diagram: http://ccnc.care/bpdiagram  Provider Name: [facility]     Phone: [phone]  Zone 1: ALL CLEAR  Continue to monitor your symptoms:  BP reading is less than 140 (top number) or less than 90 (bottom number)  No right upper stomach pain No headaches or seeing spots No feeling nauseated or throwing up No swelling in face and hands  Zone 2: CAUTION Call your doctor's office for any of the following:  BP reading is greater than 140 (top number) or greater than  90 (bottom number)  Stomach pain under your ribs in the middle or right side Headaches or seeing spots Feeling nauseated or throwing up Swelling in face and hands  Zone 3: EMERGENCY  Seek immediate medical care if you have any of the following:  BP reading is greater than160 (top number) or greater than 110 (bottom number) Severe headaches not improving with Tylenol Serious difficulty catching your breath Any worsening symptoms from  Zone 2     Second Trimester of Pregnancy The second trimester is from week 14 through week 27 (months 4 through 6). The second trimester is often a time when you feel your best. Your body has adjusted to being pregnant, and you begin to feel better physically. Usually, morning sickness has lessened or quit completely, you may have more energy, and you may have an increase in appetite. The second trimester is also a time when the fetus is growing rapidly. At the end of the sixth month, the fetus is about 9 inches long and weighs about 1 pounds. You will likely begin to feel the baby move (quickening) between 16 and 20 weeks of pregnancy. Body changes during your second trimester Your body continues to go through many changes during your second trimester. The changes vary from woman to woman. Your weight will continue to increase. You will notice your lower abdomen bulging out. You may begin to get stretch marks on your hips, abdomen, and breasts. You may develop headaches that can be relieved by medicines. The medicines should be approved by your health care provider. You may urinate more often because the fetus is pressing on your bladder. You may develop or continue to have heartburn as a result of your pregnancy. You may develop constipation because certain hormones are causing the muscles that push waste through your intestines to slow down. You may develop hemorrhoids or swollen, bulging veins (varicose veins). You may have back pain. This is caused by: Weight gain. Pregnancy hormones that are relaxing the joints in your pelvis. A shift in weight and the muscles that support your balance. Your breasts will continue to grow and they will continue to become tender. Your gums may bleed and may be sensitive to brushing and flossing. Dark spots or blotches (chloasma, mask of pregnancy) may develop on your face. This will likely fade after the baby is born. A dark line from your belly button to  the pubic area (linea nigra) may appear. This will likely fade after the baby is born. You may have changes in your hair. These can include thickening of your hair, rapid growth, and changes in texture. Some women also have hair loss during or after pregnancy, or hair that feels dry or thin. Your hair will most likely return to normal after your baby is born.  What to expect at prenatal visits During a routine prenatal visit: You will be weighed to make sure you and the fetus are growing normally. Your blood pressure will be taken. Your abdomen will be measured to track your baby's growth. The fetal heartbeat will be listened to. Any test results from the previous visit will be discussed.  Your health care provider may ask you: How you are feeling. If you are feeling the baby move. If you have had any abnormal symptoms, such as leaking fluid, bleeding, severe headaches, or abdominal cramping. If you are using any tobacco products, including cigarettes, chewing tobacco, and electronic cigarettes. If you have any questions.  Other tests that may be performed during  your second trimester include: Blood tests that check for: Low iron levels (anemia). High blood sugar that affects pregnant women (gestational diabetes) between 12 and 28 weeks. Rh antibodies. This is to check for a protein on red blood cells (Rh factor). Urine tests to check for infections, diabetes, or protein in the urine. An ultrasound to confirm the proper growth and development of the baby. An amniocentesis to check for possible genetic problems. Fetal screens for spina bifida and Down syndrome. HIV (human immunodeficiency virus) testing. Routine prenatal testing includes screening for HIV, unless you choose not to have this test.  Follow these instructions at home: Medicines Follow your health care provider's instructions regarding medicine use. Specific medicines may be either safe or unsafe to take during  pregnancy. Take a prenatal vitamin that contains at least 600 micrograms (mcg) of folic acid. If you develop constipation, try taking a stool softener if your health care provider approves. Eating and drinking Eat a balanced diet that includes fresh fruits and vegetables, whole grains, good sources of protein such as meat, eggs, or tofu, and low-fat dairy. Your health care provider will help you determine the amount of weight gain that is right for you. Avoid raw meat and uncooked cheese. These carry germs that can cause birth defects in the baby. If you have low calcium intake from food, talk to your health care provider about whether you should take a daily calcium supplement. Limit foods that are high in fat and processed sugars, such as fried and sweet foods. To prevent constipation: Drink enough fluid to keep your urine clear or pale yellow. Eat foods that are high in fiber, such as fresh fruits and vegetables, whole grains, and beans. Activity Exercise only as directed by your health care provider. Most women can continue their usual exercise routine during pregnancy. Try to exercise for 30 minutes at least 5 days a week. Stop exercising if you experience uterine contractions. Avoid heavy lifting, wear low heel shoes, and practice good posture. A sexual relationship may be continued unless your health care provider directs you otherwise. Relieving pain and discomfort Wear a good support bra to prevent discomfort from breast tenderness. Take warm sitz baths to soothe any pain or discomfort caused by hemorrhoids. Use hemorrhoid cream if your health care provider approves. Rest with your legs elevated if you have leg cramps or low back pain. If you develop varicose veins, wear support hose. Elevate your feet for 15 minutes, 3-4 times a day. Limit salt in your diet. Prenatal Care Write down your questions. Take them to your prenatal visits. Keep all your prenatal visits as told by your health  care provider. This is important. Safety Wear your seat belt at all times when driving. Make a list of emergency phone numbers, including numbers for family, friends, the hospital, and police and fire departments. General instructions Ask your health care provider for a referral to a local prenatal education class. Begin classes no later than the beginning of month 6 of your pregnancy. Ask for help if you have counseling or nutritional needs during pregnancy. Your health care provider can offer advice or refer you to specialists for help with various needs. Do not use hot tubs, steam rooms, or saunas. Do not douche or use tampons or scented sanitary pads. Do not cross your legs for long periods of time. Avoid cat litter boxes and soil used by cats. These carry germs that can cause birth defects in the baby and possibly loss of the  fetus by miscarriage or stillbirth. Avoid all smoking, herbs, alcohol, and unprescribed drugs. Chemicals in these products can affect the formation and growth of the baby. Do not use any products that contain nicotine or tobacco, such as cigarettes and e-cigarettes. If you need help quitting, ask your health care provider. Visit your dentist if you have not gone yet during your pregnancy. Use a soft toothbrush to brush your teeth and be gentle when you floss. Contact a health care provider if: You have dizziness. You have mild pelvic cramps, pelvic pressure, or nagging pain in the abdominal area. You have persistent nausea, vomiting, or diarrhea. You have a bad smelling vaginal discharge. You have pain when you urinate. Get help right away if: You have a fever. You are leaking fluid from your vagina. You have spotting or bleeding from your vagina. You have severe abdominal cramping or pain. You have rapid weight gain or weight loss. You have shortness of breath with chest pain. You notice sudden or extreme swelling of your face, hands, ankles, feet, or legs. You  have not felt your baby move in over an hour. You have severe headaches that do not go away when you take medicine. You have vision changes. Summary The second trimester is from week 14 through week 27 (months 4 through 6). It is also a time when the fetus is growing rapidly. Your body goes through many changes during pregnancy. The changes vary from woman to woman. Avoid all smoking, herbs, alcohol, and unprescribed drugs. These chemicals affect the formation and growth your baby. Do not use any tobacco products, such as cigarettes, chewing tobacco, and e-cigarettes. If you need help quitting, ask your health care provider. Contact your health care provider if you have any questions. Keep all prenatal visits as told by your health care provider. This is important. This information is not intended to replace advice given to you by your health care provider. Make sure you discuss any questions you have with your health care provider. Document Released: 04/16/2001 Document Revised: 09/28/2015 Document Reviewed: 06/23/2012 Elsevier Interactive Patient Education  2017 [employer].

## 2023-09-03 ENCOUNTER — Encounter: Payer: Self-pay | Admitting: Advanced Practice Midwife

## 2023-09-03 ENCOUNTER — Encounter: Admitting: Advanced Practice Midwife

## 2023-09-08 ENCOUNTER — Other Ambulatory Visit (HOSPITAL_COMMUNITY)
Admission: RE | Admit: 2023-09-08 | Discharge: 2023-09-08 | Disposition: A | Discharge status: Home / Self Care | Source: Ambulatory Visit | Attending: Advanced Practice Midwife | Admitting: Advanced Practice Midwife

## 2023-09-08 ENCOUNTER — Encounter: Payer: Self-pay | Admitting: Advanced Practice Midwife

## 2023-09-08 ENCOUNTER — Ambulatory Visit: Admitting: Advanced Practice Midwife

## 2023-09-08 VITALS — BP 128/69 | HR 86 | Wt 166.0 lb

## 2023-09-08 DIAGNOSIS — N898 Other specified noninflammatory disorders of vagina: Secondary | ICD-10-CM | POA: Insufficient documentation

## 2023-09-08 DIAGNOSIS — O26892 Other specified pregnancy related conditions, second trimester: Secondary | ICD-10-CM | POA: Insufficient documentation

## 2023-09-08 DIAGNOSIS — Z3A24 24 weeks gestation of pregnancy: Secondary | ICD-10-CM

## 2023-09-08 NOTE — Addendum Note (Signed)
 Addended by: Myrl Askew on: 09/08/2023 04:45 PM   Modules accepted: Orders

## 2023-09-08 NOTE — Patient Instructions (Signed)
 Zoe Carroll, I greatly value your feedback.  If you receive a survey following your visit with us  today, we appreciate you taking the time to fill it out.  Thanks, Zoe Carroll, CNM   You will have your sugar test next visit.  Please do not eat or drink anything after midnight the night before you come, not even water.  You will be here for at least two hours.  Please make an appointment online for the bloodwork at [email or web address] for 8:30am (or as close to this as possible). Make sure you select the [facility] service center. The day of the appointment, check in with our office first, then you will go to [facility] to start the sugar test.    [facility] HAS MOVED!!! It is now [facility] Center at [facility] ([address] [place], [place] [address]) Entrance C, located off of [address] [employer] valet parking  Go to [email or web address] to register for FREE online childbirth classes   Call the office [phone]) or go to [facility] if: You begin to have strong, frequent contractions Your water breaks.  Sometimes it is a big gush of fluid, sometimes it is just a trickle that keeps getting your panties wet or running down your legs You have vaginal bleeding.  It is normal to have a small amount of spotting if your cervix was checked.  You don't feel your baby moving like normal.  If you don't, get you something to eat and drink and lay down and focus on feeling your baby move.   If your baby is still not moving like normal, you should call the office or go to [facility]  [place] Pediatricians/Family Doctors: [facility] [phone]           [facility] [phone]                [facility] [phone] (usually not accepting new patients unless you have family there already, you are always welcome to call and ask)      [facility] Department [phone]       [facility] Pediatricians/Family Doctors:  [facility]: [phone] [facility]: [phone] [facility]: [phone]  [facility] Doctors:  [facility]: [phone]  [facility]: [phone]  [facility] Doctors: [facility]: [phone]   Home Blood Pressure Monitoring for Patients   Your provider has recommended that you check your blood pressure (BP) at least once a week at home. If you do not have a blood pressure cuff at home, one will be provided for you. Contact your provider if you have not received your monitor within 1 week.   Helpful Tips for Accurate Home Blood Pressure Checks  Don't smoke, exercise, or drink caffeine 30 minutes before checking your BP Use the restroom before checking your BP (a full bladder can raise your pressure) Relax in a comfortable upright chair Feet on the ground Left arm resting comfortably on a flat surface at the level of your heart Legs uncrossed Back supported Sit quietly and don't talk Place the cuff on your bare arm Adjust snuggly, so that only two fingertips can fit between your skin and the top of the cuff Check 2 readings separated by at least one minute Keep a log of your BP readings For a visual, please reference this diagram: http://ccnc.care/bpdiagram  Provider Name: [facility]     Phone: [phone]  Zone 1: ALL CLEAR  Continue to monitor your symptoms:  BP reading is less than 140 (top number) or less than 90 (bottom number)  No right upper stomach pain No headaches or seeing spots No feeling nauseated or throwing up No swelling in face and hands  Zone 2: CAUTION Call your doctor's office for any of the following:  BP reading is greater than 140 (top number) or greater than 90 (bottom number)  Stomach pain under your ribs in the middle or right side Headaches or seeing spots Feeling nauseated or throwing up Swelling in face and hands  Zone 3: EMERGENCY  Seek  immediate medical care if you have any of the following:  BP reading is greater than160 (top number) or greater than 110 (bottom number) Severe headaches not improving with Tylenol  Serious difficulty catching your breath Any worsening symptoms from Zone 2   Second Trimester of Pregnancy The second trimester is from week 13 through week 28, months 4 through 6. The second trimester is often a time when you feel your best. Your body has also adjusted to being pregnant, and you begin to feel better physically. Usually, morning sickness has lessened or quit completely, you may have more energy, and you may have an increase in appetite. The second trimester is also a time when the fetus is growing rapidly. At the end of the sixth month, the fetus is about 9 inches long and weighs about 1 pounds. You will likely begin to feel the baby move (quickening) between 18 and 20 weeks of the pregnancy. BODY CHANGES Your body goes through many changes during pregnancy. The changes vary from woman to woman.  Your weight will continue to increase. You will notice your lower abdomen bulging out. You may begin to get stretch marks on your hips, abdomen, and breasts. You may develop headaches that can be relieved by medicines approved by your health care provider. You may urinate more often because the fetus is pressing on your bladder. You may develop or continue to have heartburn as a result of your pregnancy. You may develop constipation because certain hormones are causing the muscles that push waste through your intestines to slow down. You may develop hemorrhoids or swollen, bulging veins (varicose veins). You may have back pain because of the weight gain and pregnancy hormones relaxing your joints between the bones in your pelvis and as a result of a shift in weight and the muscles that support your balance. Your breasts will continue to grow and be tender. Your gums may bleed and may be sensitive to brushing  and flossing. Dark spots or blotches (chloasma, mask of pregnancy) may develop on your face. This will likely fade after the baby is born. A dark line from your belly button to the pubic area (linea nigra) may appear. This will likely fade after the baby is born. You may have changes in your hair. These can include thickening of your hair, rapid growth, and changes in texture. Some women also have hair loss during or after pregnancy, or hair that feels dry or thin. Your hair will most likely return to normal after your baby is born. WHAT TO EXPECT AT YOUR PRENATAL VISITS During a routine prenatal visit: You will be weighed to make sure you and the fetus are growing normally. Your blood pressure will be taken. Your abdomen will be measured to track your baby's growth. The fetal heartbeat will be listened to. Any test results from the previous visit will be discussed. Your health care provider  may ask you: How you are feeling. If you are feeling the baby move. If you have had any abnormal symptoms, such as leaking fluid, bleeding, severe headaches, or abdominal cramping. If you have any questions. Other tests that may be performed during your second trimester include: Blood tests that check for: Low iron levels (anemia). Gestational diabetes (between 24 and 28 weeks). Rh antibodies. Urine tests to check for infections, diabetes, or protein in the urine. An ultrasound to confirm the proper growth and development of the baby. An amniocentesis to check for possible genetic problems. Fetal screens for spina bifida and Down syndrome. HOME CARE INSTRUCTIONS  Avoid all smoking, herbs, alcohol, and unprescribed drugs. These chemicals affect the formation and growth of the baby. Follow your health care provider's instructions regarding medicine use. There are medicines that are either safe or unsafe to take during pregnancy. Exercise only as directed by your health care provider. Experiencing  uterine cramps is a good sign to stop exercising. Continue to eat regular, healthy meals. Wear a good support bra for breast tenderness. Do not use hot tubs, steam rooms, or saunas. Wear your seat belt at all times when driving. Avoid raw meat, uncooked cheese, cat litter boxes, and soil used by cats. These carry germs that can cause birth defects in the baby. Take your prenatal vitamins. Try taking a stool softener (if your health care provider approves) if you develop constipation. Eat more high-fiber foods, such as fresh vegetables or fruit and whole grains. Drink plenty of fluids to keep your urine clear or pale yellow. Take warm sitz baths to soothe any pain or discomfort caused by hemorrhoids. Use hemorrhoid cream if your health care provider approves. If you develop varicose veins, wear support hose. Elevate your feet for 15 minutes, 3-4 times a day. Limit salt in your diet. Avoid heavy lifting, wear low heel shoes, and practice good posture. Rest with your legs elevated if you have leg cramps or low back pain. Visit your dentist if you have not gone yet during your pregnancy. Use a soft toothbrush to brush your teeth and be gentle when you floss. A sexual relationship may be continued unless your health care provider directs you otherwise. Continue to go to all your prenatal visits as directed by your health care provider. SEEK MEDICAL CARE IF:  You have dizziness. You have mild pelvic cramps, pelvic pressure, or nagging pain in the abdominal area. You have persistent nausea, vomiting, or diarrhea. You have a bad smelling vaginal discharge. You have pain with urination. SEEK IMMEDIATE MEDICAL CARE IF:  You have a fever. You are leaking fluid from your vagina. You have spotting or bleeding from your vagina. You have severe abdominal cramping or pain. You have rapid weight gain or loss. You have shortness of breath with chest pain. You notice sudden or extreme swelling of your face,  hands, ankles, feet, or legs. You have not felt your baby move in over an hour. You have severe headaches that do not go away with medicine. You have vision changes. Document Released: 04/16/2001 Document Revised: 04/27/2013 Document Reviewed: 06/23/2012 [facility] Patient Information 2015 [place], [place]. This information is not intended to replace advice given to you by your health care provider. Make sure you discuss any questions you have with your health care provider.

## 2023-09-08 NOTE — Progress Notes (Signed)
   LOW-RISK PREGNANCY VISIT Patient name: Zoe Carroll MRN [id number]  Date of birth: 2005/11/10 Chief Complaint:   Routine Prenatal Visit (Vaginal irritation, discharge/)  History of Present Illness:   Zoe Carroll is a 17 y.o. G52P1001 female at [redacted]w[redacted]d with an Estimated Date of Delivery: 12/25/23 being seen today for ongoing management of a low-risk pregnancy.  Today she reports  yellow vag d/c- is irritating esp after sex . Contractions: Not present. Vag. Bleeding: None.  Movement: Present. denies leaking of fluid. Review of Systems:   Pertinent items are noted in HPI Denies abnormal vaginal discharge w/ itching/odor/irritation, headaches, visual changes, shortness of breath, chest pain, abdominal pain, severe nausea/vomiting, or problems with urination or bowel movements unless otherwise stated above. Pertinent History Reviewed:  Reviewed past medical,surgical, social, obstetrical and family history.  Reviewed problem list, medications and allergies. Physical Assessment:   Vitals:   09/08/23 1622 09/08/23 1623  BP: 138/76 128/69  Pulse: 86   Weight: 166 lb (75.3 kg)   Body mass index is 26 kg/m.        Physical Examination:   General appearance: Well appearing, and in no distress  Mental status: Alert, oriented to person, place, and time  Skin: Warm & dry  Cardiovascular: Normal heart rate noted  Respiratory: Normal respiratory effort, no distress  Abdomen: Soft, gravid, nontender  Pelvic: Cervical exam deferred         Extremities: Edema: None  Fetal Status: Fetal Heart Rate (bpm): 147 Fundal Height: 24 cm Movement: Present    No results found for this or any previous visit (from the past 24 hours).  Assessment & Plan:  1) Low-risk pregnancy G2P1001 at [redacted]w[redacted]d with an Estimated Date of Delivery: 12/25/23   2) Vag irritation/yellow d/c, CV swab sent   Meds: No orders of the defined types were placed in this encounter.  Labs/procedures today: CV  Plan:  Continue routine  obstetrical care   Reviewed: Preterm labor symptoms and general obstetric precautions including but not limited to vaginal bleeding, contractions, leaking of fluid and fetal movement were reviewed in detail with the patient.  All questions were answered. Has home bp cuff.  Check bp weekly, let us  know if >140/90- hasn't been able to figure out how to work it- recommended bringing it to her next visit and we will look at it.   Follow-up: Return in about 3 weeks (around 09/29/2023) for LROB, PN2.  No orders of the defined types were placed in this encounter.  Jolayne Natter CNM 09/08/2023 4:38 PM

## 2023-09-10 ENCOUNTER — Other Ambulatory Visit: Payer: Self-pay | Admitting: Advanced Practice Midwife

## 2023-09-10 ENCOUNTER — Encounter: Payer: Self-pay | Admitting: Advanced Practice Midwife

## 2023-09-10 DIAGNOSIS — B3731 Acute candidiasis of vulva and vagina: Secondary | ICD-10-CM

## 2023-09-10 DIAGNOSIS — B9689 Other specified bacterial agents as the cause of diseases classified elsewhere: Secondary | ICD-10-CM

## 2023-09-10 LAB — CERVICOVAGINAL ANCILLARY ONLY
Bacterial Vaginitis (gardnerella): POSITIVE — AB
Candida Glabrata: NEGATIVE
Candida Vaginitis: POSITIVE — AB
Chlamydia: NEGATIVE
Comment: NEGATIVE
Comment: NEGATIVE
Comment: NEGATIVE
Comment: NEGATIVE
Comment: NEGATIVE
Comment: NORMAL
Neisseria Gonorrhea: NEGATIVE
Trichomonas: NEGATIVE

## 2023-09-10 MED ORDER — METRONIDAZOLE 500 MG PO TABS: 500.0000 mg | ORAL_TABLET | Freq: Two times a day (BID) | ORAL | 0 refills | Status: DC

## 2023-09-10 MED ORDER — TERCONAZOLE 0.4 % VA CREA: 1.0000 | TOPICAL_CREAM | Freq: Every day | VAGINAL | 0 refills | Status: DC

## 2023-09-15 ENCOUNTER — Encounter (HOSPITAL_COMMUNITY): Payer: Self-pay | Admitting: Family Medicine

## 2023-09-15 ENCOUNTER — Inpatient Hospital Stay (HOSPITAL_COMMUNITY)
Admission: EM | Admit: 2023-09-15 | Discharge: 2023-09-15 | Disposition: A | Discharge status: Home / Self Care | Attending: Family Medicine | Admitting: Family Medicine

## 2023-09-15 DIAGNOSIS — R0602 Shortness of breath: Secondary | ICD-10-CM | POA: Diagnosis not present

## 2023-09-15 DIAGNOSIS — O98812 Other maternal infectious and parasitic diseases complicating pregnancy, second trimester: Secondary | ICD-10-CM | POA: Diagnosis not present

## 2023-09-15 DIAGNOSIS — R1031 Right lower quadrant pain: Secondary | ICD-10-CM | POA: Diagnosis not present

## 2023-09-15 DIAGNOSIS — O99342 Other mental disorders complicating pregnancy, second trimester: Secondary | ICD-10-CM | POA: Diagnosis not present

## 2023-09-15 DIAGNOSIS — F419 Anxiety disorder, unspecified: Secondary | ICD-10-CM | POA: Diagnosis not present

## 2023-09-15 DIAGNOSIS — F410 Panic disorder [episodic paroxysmal anxiety] without agoraphobia: Secondary | ICD-10-CM | POA: Diagnosis present

## 2023-09-15 DIAGNOSIS — Z3A25 25 weeks gestation of pregnancy: Secondary | ICD-10-CM | POA: Diagnosis not present

## 2023-09-15 DIAGNOSIS — N760 Acute vaginitis: Secondary | ICD-10-CM | POA: Diagnosis present

## 2023-09-15 DIAGNOSIS — B3731 Acute candidiasis of vulva and vagina: Secondary | ICD-10-CM | POA: Insufficient documentation

## 2023-09-15 DIAGNOSIS — Z711 Person with feared health complaint in whom no diagnosis is made: Secondary | ICD-10-CM

## 2023-09-15 DIAGNOSIS — B9689 Other specified bacterial agents as the cause of diseases classified elsewhere: Secondary | ICD-10-CM | POA: Insufficient documentation

## 2023-09-15 DIAGNOSIS — O26892 Other specified pregnancy related conditions, second trimester: Secondary | ICD-10-CM

## 2023-09-15 LAB — URINALYSIS, ROUTINE W REFLEX MICROSCOPIC
Bilirubin Urine: NEGATIVE
Glucose, UA: NEGATIVE mg/dL
Hgb urine dipstick: NEGATIVE
Ketones, ur: NEGATIVE mg/dL
Nitrite: NEGATIVE
Protein, ur: NEGATIVE mg/dL
Specific Gravity, Urine: 1.025 (ref 1.005–1.030)
pH: 6 (ref 5.0–8.0)

## 2023-09-15 MED ORDER — HYDROXYZINE HCL 25 MG PO TABS
25.0000 mg | ORAL_TABLET | ORAL | Status: DC
  Filled 2023-09-15: qty 1

## 2023-09-15 MED ORDER — HYDROXYZINE HCL 25 MG PO TABS: 25.0000 mg | ORAL_TABLET | Freq: Four times a day (QID) | ORAL | 2 refills | Status: DC

## 2023-09-15 NOTE — MAU Note (Signed)
 Zoe Carroll is a 18 y.o. at [redacted]w[redacted]d here in MAU reporting: yesterday, before she went to bed, it felt like she was having a panic attack, which she gets. Wasn't sure if this was from the meds she is on. (Treatment for yeast and BV) Every time she was about to fall asleep, it felt like she would stop breathing and get hot flashes.  Continues to come and go in waves.  Having pain in lower abd and RLQ, been going on for a wk, got really bad last night. Sharp pains.   Onset of complaint: got really bad last night.  Pain score: mild Vitals:   09/15/23 0847 09/15/23 0851  BP:  115/64  Pulse:  98  Resp:  17  Temp:  98.4 F (36.9 C)  SpO2: 100% 100%     FHT:150 Lab orders placed from triage:  urine

## 2023-09-15 NOTE — MAU Provider Note (Addendum)
 History   [name] is a 18 y.o. year old G52P1001 female at [redacted]w[redacted]d weeks gestation who presents to MAU reporting ongoing abdominal pain x 1 week and SOB similar to when she has panic attacks. She was dx with BV and yeast last week and started her metronidazole  treatment yesterday morning around 8am. About an hour after she went to sleep, she woke up feeling tightness in her chest that was off and on throughout the night. She reports experiencing panic attacks about every other day and was being managed by a psychologist just prior to pregnancy who prescribed her medication for OCD and panic attacks but she did not initiate treatment due to finding out she was pregnant. She denies any current SOB, chest tightening, vaginal bleeding, leaking of fluid, and +FM.  CSN: [id number]  Arrival date and time: 09/15/23 1478   Event Date/Time   First Provider Initiated Contact with Patient 09/15/23 [phone]      Chief Complaint  Patient presents with   Shortness of Breath   Abdominal Pain   Panic Attack   Shortness of Breath Associated symptoms include abdominal pain.  Abdominal Pain    OB History     Gravida  2   Para  1   Term  1   Preterm      AB      Living  1      SAB      IAB      Ectopic      Multiple  0   Live Births  1           Past Medical History:  Diagnosis Date   Medical history non-contributory     Past Surgical History:  Procedure Laterality Date   NO PAST SURGERIES      Family History  Problem Relation Age of Onset   Lupus Mother    Colitis Mother    Muscular dystrophy Mother    COPD Father    Stroke Maternal Grandmother    Lung cancer Paternal Grandmother    Skin cancer Paternal Grandmother    Heart attack Paternal Grandfather     Social History   Tobacco Use   Smoking status: Never   Smokeless tobacco: Never  Vaping Use   Vaping status: Never Used  Substance Use Topics   Alcohol use: Never   Drug use: Never     Allergies:  Allergies  Allergen Reactions   Amoxicillin Anaphylaxis   Penicillin G Anaphylaxis    Medications Prior to Admission  Medication Sig Dispense Refill Last Dose/Taking   metroNIDAZOLE  (FLAGYL ) 500 MG tablet Take 1 tablet (500 mg total) by mouth 2 (two) times daily. 14 tablet 0 09/14/2023   prenatal vitamin w/FE, FA (PRENATAL 1 + 1) 27-1 MG TABS tablet Take 1 tablet by mouth daily at 12 noon. 30 tablet 12 09/14/2023   terconazole  (TERAZOL 7 ) 0.4 % vaginal cream Place 1 applicator vaginally at bedtime. 45 g 0 09/14/2023   Blood Pressure Monitor MISC For regular home bp monitoring during pregnancy 1 each 0     Review of Systems  Respiratory:  Positive for shortness of breath.   Gastrointestinal:  Positive for abdominal pain.   Pertinent items noted in HPI and remainder of comprehensive ROS otherwise negative.   Physical Exam   Blood pressure (!) 107/49, pulse 80, temperature 98.4 F (36.9 C), temperature source Oral, resp. rate 15, height 5\' 7"  (1.702 m), weight 75.5 kg, last menstrual period 03/20/2023, SpO2  100%, currently breastfeeding.  Physical Exam Constitutional:      Appearance: She is well-developed.  Cardiovascular:     Rate and Rhythm: Normal rate.  Pulmonary:     Effort: Pulmonary effort is normal.  Abdominal:     Palpations: Abdomen is soft.     Comments: Right side and lower abdominal tenderness  Neurological:     Mental Status: She is alert.     MAU Course  Procedures  MDM Offered CMP and CBC to evaluate liver enzymes, patient declined Offered initiating Vistaril  while in MAU, patient prefers to take at home.  Assessment and Plan   Problem List Items Addressed This Visit   None Visit Diagnoses       Anxiety    -  Primary   Relevant Medications   hydrOXYzine  (ATARAX ) tablet 25 mg   hydrOXYzine  (ATARAX ) 25 MG tablet     [redacted] weeks gestation of pregnancy         Physically well but worried           - Continue taking metronidazole   and terazol as prescribed/ They are not likely related to the panic attack. - Reviewed precautions to return if symptoms worsen, are unrelieved with medication, or for any other pregnancy related concerns.   Wilford Hanks 09/15/2023, 10:19 AM

## 2023-09-30 ENCOUNTER — Ambulatory Visit: Admitting: Obstetrics & Gynecology

## 2023-09-30 ENCOUNTER — Encounter: Payer: Self-pay | Admitting: Obstetrics & Gynecology

## 2023-09-30 ENCOUNTER — Other Ambulatory Visit

## 2023-09-30 VITALS — BP 116/66 | HR 83 | Wt 170.0 lb

## 2023-09-30 DIAGNOSIS — Z131 Encounter for screening for diabetes mellitus: Secondary | ICD-10-CM

## 2023-09-30 DIAGNOSIS — Z3482 Encounter for supervision of other normal pregnancy, second trimester: Secondary | ICD-10-CM | POA: Diagnosis not present

## 2023-09-30 DIAGNOSIS — Z3A27 27 weeks gestation of pregnancy: Secondary | ICD-10-CM

## 2023-09-30 NOTE — Progress Notes (Signed)
   LOW-RISK PREGNANCY VISIT Patient name: Zoe Carroll MRN [id number]  Date of birth: 04/13/2006 Chief Complaint:   Routine Prenatal Visit  History of Present Illness:   Zoe Carroll is a 18 y.o. G9P1001 female at [redacted]w[redacted]d with an Estimated Date of Delivery: 12/25/23 being seen today for ongoing management of a low-risk pregnancy.     06/11/2023    3:32 PM 11/01/2021    4:23 PM  Depression screen PHQ 2/9  Decreased Interest 0 0  Down, Depressed, Hopeless 0 0  PHQ - 2 Score 0 0  Altered sleeping 0 0  Tired, decreased energy 1 1  Change in appetite 1 0  Feeling bad or failure about yourself  0 0  Trouble concentrating 0 0  Moving slowly or fidgety/restless 0 0  Suicidal thoughts 0 0  PHQ-9 Score 2 1    Today she reports no complaints. Contractions: Not present. Vag. Bleeding: None.  Movement: Present. denies leaking of fluid. Review of Systems:   Pertinent items are noted in HPI Denies abnormal vaginal discharge w/ itching/odor/irritation, headaches, visual changes, shortness of breath, chest pain, abdominal pain, severe nausea/vomiting, or problems with urination or bowel movements unless otherwise stated above. Pertinent History Reviewed:  Reviewed past medical,surgical, social, obstetrical and family history.  Reviewed problem list, medications and allergies. Physical Assessment:   Vitals:   09/30/23 0951  BP: 116/66  Pulse: 83  Weight: 170 lb (77.1 kg)  Body mass index is 26.63 kg/m.        Physical Examination:   General appearance: Well appearing, and in no distress  Mental status: Alert, oriented to person, place, and time  Skin: Warm & dry  Cardiovascular: Normal heart rate noted  Respiratory: Normal respiratory effort, no distress  Abdomen: Soft, gravid, nontender  Pelvic: Cervical exam deferred         Extremities:    Fetal Status:     Movement: Present    Chaperone: n/a    No results found for this or any previous visit (from the past 24 hours).   Assessment & Plan:  1) Low-risk pregnancy G2P1001 at [redacted]w[redacted]d with an Estimated Date of Delivery: 12/25/23      Meds: No orders of the defined types were placed in this encounter.  Labs/procedures today: PN2  Plan:  Continue routine obstetrical care  Next visit: prefers in person    Reviewed: Preterm labor symptoms and general obstetric precautions including but not limited to vaginal bleeding, contractions, leaking of fluid and fetal movement were reviewed in detail with the patient.  All questions were answered. Has home bp cuff. Rx faxed to  Check bp weekly, let us  know if >140/90.   Follow-up: No follow-ups on file.  No orders of the defined types were placed in this encounter.   Wendelyn Halter, MD 09/30/2023 10:04 AM

## 2023-10-02 LAB — CBC
Hematocrit: 38.4 % (ref 34.0–46.6)
Hemoglobin: 12.9 g/dL (ref 11.1–15.9)
MCH: 29.2 pg (ref 26.6–33.0)
MCHC: 33.6 g/dL (ref 31.5–35.7)
MCV: 87 fL (ref 79–97)
Platelets: 239 10*3/uL (ref 150–450)
RBC: 4.42 x10E6/uL (ref 3.77–5.28)
RDW: 13.1 % (ref 11.7–15.4)
WBC: 8.8 10*3/uL (ref 3.4–10.8)

## 2023-10-02 LAB — HIV ANTIBODY (ROUTINE TESTING W REFLEX)

## 2023-10-02 LAB — GLUCOSE TOLERANCE, 2 HOURS W/ 1HR
Glucose, 1 hour: 129 mg/dL (ref 70–179)
Glucose, 2 hour: 75 mg/dL (ref 70–152)
Glucose, Fasting: 82 mg/dL (ref 70–91)

## 2023-10-02 LAB — RPR: RPR Ser Ql: NONREACTIVE

## 2023-10-02 LAB — ANTIBODY SCREEN: Antibody Screen: NEGATIVE

## 2023-10-24 ENCOUNTER — Encounter: Payer: Self-pay | Admitting: Women's Health

## 2023-10-28 ENCOUNTER — Ambulatory Visit: Admitting: Women's Health

## 2023-10-28 ENCOUNTER — Encounter: Payer: Self-pay | Admitting: Women's Health

## 2023-10-28 VITALS — BP 118/72 | HR 103 | Wt 175.0 lb

## 2023-10-28 DIAGNOSIS — Z3483 Encounter for supervision of other normal pregnancy, third trimester: Secondary | ICD-10-CM | POA: Diagnosis not present

## 2023-10-28 DIAGNOSIS — Z23 Encounter for immunization: Secondary | ICD-10-CM | POA: Diagnosis not present

## 2023-10-28 DIAGNOSIS — Z3480 Encounter for supervision of other normal pregnancy, unspecified trimester: Secondary | ICD-10-CM

## 2023-10-28 DIAGNOSIS — Z3A31 31 weeks gestation of pregnancy: Secondary | ICD-10-CM

## 2023-10-28 NOTE — Progress Notes (Signed)
    LOW-RISK PREGNANCY VISIT Patient name: Zoe Carroll MRN [id number]  Date of birth: Sep 13, 2005 Chief Complaint:   Routine Prenatal Visit  History of Present Illness:   Zoe Carroll is a 18 y.o. G41P1001 female at [redacted]w[redacted]d with an Estimated Date of Delivery: 12/25/23 being seen today for ongoing management of a low-risk pregnancy.   Today she reports no complaints. Contractions: Not present.  .  Movement: Present. denies leaking of fluid.     06/11/2023    3:32 PM 11/01/2021    4:23 PM  Depression screen PHQ 2/9  Decreased Interest 0 0  Down, Depressed, Hopeless 0 0  PHQ - 2 Score 0 0  Altered sleeping 0 0  Tired, decreased energy 1 1  Change in appetite 1 0  Feeling bad or failure about yourself  0 0  Trouble concentrating 0 0  Moving slowly or fidgety/restless 0 0  Suicidal thoughts 0 0  PHQ-9 Score 2 1        06/11/2023    3:32 PM 11/01/2021    4:23 PM  GAD 7 : Generalized Anxiety Score  Nervous, Anxious, on Edge 0 0  Control/stop worrying 0 0  Worry too much - different things 0 0  Trouble relaxing 0 0  Restless 0 0  Easily annoyed or irritable 0 0  Afraid - awful might happen 0 0  Total GAD 7 Score 0 0      Review of Systems:   Pertinent items are noted in HPI Denies abnormal vaginal discharge w/ itching/odor/irritation, headaches, visual changes, shortness of breath, chest pain, abdominal pain, severe nausea/vomiting, or problems with urination or bowel movements unless otherwise stated above. Pertinent History Reviewed:  Reviewed past medical,surgical, social, obstetrical and family history.  Reviewed problem list, medications and allergies. Physical Assessment:   Vitals:   10/28/23 1612  BP: 118/72  Pulse: (!) 103  Weight: 175 lb (79.4 kg)  Body mass index is 27.41 kg/m.        Physical Examination:   General appearance: Well appearing, and in no distress  Mental status: Alert, oriented to person, place, and time  Skin: Warm & dry  Cardiovascular:  Normal heart rate noted  Respiratory: Normal respiratory effort, no distress  Abdomen: Soft, gravid, nontender  Pelvic: Cervical exam deferred         Extremities: Edema: None  Fetal Status: Fetal Heart Rate (bpm): 148 Fundal Height: 29 cm Movement: Present    Chaperone: N/A No results found for this or any previous visit (from the past 24 hours).  Assessment & Plan:  1) Low-risk pregnancy G2P1001 at [redacted]w[redacted]d with an Estimated Date of Delivery: 12/25/23     Meds: No orders of the defined types were placed in this encounter.  Labs/procedures today: Tdap  Plan:  Continue routine obstetrical care  Next visit: prefers in person    Reviewed: Preterm labor symptoms and general obstetric precautions including but not limited to vaginal bleeding, contractions, leaking of fluid and fetal movement were reviewed in detail with the patient.  All questions were answered. Does have home bp cuff. Office bp cuff given: not applicable. Check bp weekly, let us  know if consistently >140 and/or >90.  Follow-up: Return in about 2 weeks (around 11/11/2023) for LROB, CNM, in person.  No future appointments.  Orders Placed This Encounter  Procedures   Tdap vaccine greater than or equal to 7yo IM   Suzen JONELLE Fetters CNM, [facility] 10/28/2023 4:38 PM

## 2023-10-28 NOTE — Patient Instructions (Signed)
 Zoe Carroll, thank you for choosing our office today! We appreciate the opportunity to meet your healthcare needs. You may receive a short survey by mail, e-mail, or through [employer]. If you are happy with your care we would appreciate if you could take just a few minutes to complete the survey questions. We read all of your comments and take your feedback very seriously. Thank you again for choosing our office.  Center for [employer] Team at [facility]  [facility] at [facility] ([address] [place], [place] [address]) Entrance C, located off of [address] Free 24/7 valet parking   CLASSES: Go to [email or web address] to register for classes (childbirth, breastfeeding, waterbirth, infant CPR, daddy bootcamp, etc.)  Call the office [phone]) or go to [facility] if: You begin to have strong, frequent contractions Your water breaks.  Sometimes it is a big gush of fluid, sometimes it is just a trickle that keeps getting your panties wet or running down your legs You have vaginal bleeding.  It is normal to have a small amount of spotting if your cervix was checked.  You don't feel your baby moving like normal.  If you don't, get you something to eat and drink and lay down and focus on feeling your baby move.   If your baby is still not moving like normal, you should call the office or go to [facility].  Call the office [phone]) or go to [facility] hospital for these signs of pre-eclampsia: Severe headache that does not go away with Tylenol  Visual changes- seeing spots, double, blurred vision Pain under your right breast or upper abdomen that does not go away with Tums or heartburn medicine Nausea and/or vomiting Severe swelling in your hands, feet, and face   Tdap Vaccine It is recommended that you get the Tdap vaccine during the third trimester of EACH pregnancy to help protect your baby from getting pertussis (whooping cough) 27-36 weeks is the BEST time to do  this so that you can pass the protection on to your baby. During pregnancy is better than after pregnancy, but if you are unable to get it during pregnancy it will be offered at the hospital.  You can get this vaccine with us , at the health department, your family doctor, or some local pharmacies Everyone who will be around your baby should also be up-to-date on their vaccines before the baby comes. Adults (who are not pregnant) only need 1 dose of Tdap during adulthood.   [facility] Pediatricians/Family Doctors [facility]): [address] [place], [phone]           [facility]: [address], [phone]                [facility] Medicine [facility]): [address], [phone] (call to ask if accepting patients) [facility] Department: [address], [place], [phone]    [facility] Pediatricians/Family Doctors [facility]): [phone], [phone] [facility]: [address] [place], [phone] [facility] [place]: [address], [phone]  [facility] Doctors  [facility]): [phone] [facility]: [address] [phone]   [facility] Doctors [facility]: [address], [phone]  [facility] Family Doctors  [facility]: [phone], [phone]  Home Blood Pressure Monitoring for Patients   Your provider has recommended that you check your  blood pressure (BP) at least once a week at home. If you do not have a blood pressure cuff at home, one will be provided for you. Contact your provider if you have not received your monitor within 1 week.   Helpful Tips for Accurate Home Blood Pressure Checks  Don't smoke, exercise, or drink caffeine 30 minutes before checking your BP Use the restroom before checking your BP (a full bladder can raise your  pressure) Relax in a comfortable upright chair Feet on the ground Left arm resting comfortably on a flat surface at the level of your heart Legs uncrossed Back supported Sit quietly and don't talk Place the cuff on your bare arm Adjust snuggly, so that only two fingertips can fit between your skin and the top of the cuff Check 2 readings separated by at least one minute Keep a log of your BP readings For a visual, please reference this diagram: http://ccnc.care/bpdiagram  Provider Name: [facility]     Phone: [phone]  Zone 1: ALL CLEAR  Continue to monitor your symptoms:  BP reading is less than 140 (top number) or less than 90 (bottom number)  No right upper stomach pain No headaches or seeing spots No feeling nauseated or throwing up No swelling in face and hands  Zone 2: CAUTION Call your doctor's office for any of the following:  BP reading is greater than 140 (top number) or greater than 90 (bottom number)  Stomach pain under your ribs in the middle or right side Headaches or seeing spots Feeling nauseated or throwing up Swelling in face and hands  Zone 3: EMERGENCY  Seek immediate medical care if you have any of the following:  BP reading is greater than160 (top number) or greater than 110 (bottom number) Severe headaches not improving with Tylenol  Serious difficulty catching your breath Any worsening symptoms from Zone 2  Preterm Labor and Birth Information  The normal length of a pregnancy is 39-41 weeks. Preterm labor is when labor starts before 37 completed weeks of pregnancy. What are the risk factors for preterm labor? Preterm labor is more likely to occur in women who: Have certain infections during pregnancy such as a bladder infection, sexually transmitted infection, or infection inside the uterus (chorioamnionitis). Have a shorter-than-normal cervix. Have gone into preterm labor before. Have had surgery on their cervix. Are younger than age 19  or older than age 33. Are African American. Are pregnant with twins or multiple babies (multiple gestation). Take street drugs or smoke while pregnant. Do not gain enough weight while pregnant. Became pregnant shortly after having been pregnant. What are the symptoms of preterm labor? Symptoms of preterm labor include: Cramps similar to those that can happen during a menstrual period. The cramps may happen with diarrhea. Pain in the abdomen or lower back. Regular uterine contractions that may feel like tightening of the abdomen. A feeling of increased pressure in the pelvis. Increased watery or bloody mucus discharge from the vagina. Water breaking (ruptured amniotic sac). Why is it important to recognize signs of preterm labor? It is important to recognize signs of preterm labor because babies who are born prematurely may not be fully developed. This can put them at an increased risk for: Long-term (chronic) heart and lung problems. Difficulty immediately after birth with regulating body systems, including blood sugar, body temperature, heart rate, and breathing rate. Bleeding in the brain. Cerebral palsy. Learning difficulties. Death. These risks are highest for babies who are born before 34 weeks  of pregnancy. How is preterm labor treated? Treatment depends on the length of your pregnancy, your condition, and the health of your baby. It may involve: Having a stitch (suture) placed in your cervix to prevent your cervix from opening too early (cerclage). Taking or being given medicines, such as: Hormone medicines. These may be given early in pregnancy to help support the pregnancy. Medicine to stop contractions. Medicines to help mature the baby's lungs. These may be prescribed if the risk of delivery is high. Medicines to prevent your baby from developing cerebral palsy. If the labor happens before 34 weeks of pregnancy, you may need to stay in the hospital. What should I do if I  think I am in preterm labor? If you think that you are going into preterm labor, call your health care provider right away. How can I prevent preterm labor in future pregnancies? To increase your chance of having a full-term pregnancy: Do not use any tobacco products, such as cigarettes, chewing tobacco, and e-cigarettes. If you need help quitting, ask your health care provider. Do not use street drugs or medicines that have not been prescribed to you during your pregnancy. Talk with your health care provider before taking any herbal supplements, even if you have been taking them regularly. Make sure you gain a healthy amount of weight during your pregnancy. Watch for infection. If you think that you might have an infection, get it checked right away. Make sure to tell your health care provider if you have gone into preterm labor before. This information is not intended to replace advice given to you by your health care provider. Make sure you discuss any questions you have with your health care provider. Document Revised: 08/14/2018 Document Reviewed: 09/13/2015 Elsevier Patient Education  2020 [employer].

## 2023-11-11 ENCOUNTER — Ambulatory Visit (INDEPENDENT_AMBULATORY_CARE_PROVIDER_SITE_OTHER): Admitting: Women's Health

## 2023-11-11 ENCOUNTER — Encounter: Payer: Self-pay | Admitting: Women's Health

## 2023-11-11 VITALS — BP 121/64 | HR 86 | Wt 176.6 lb

## 2023-11-11 DIAGNOSIS — Z3A33 33 weeks gestation of pregnancy: Secondary | ICD-10-CM

## 2023-11-11 DIAGNOSIS — O99013 Anemia complicating pregnancy, third trimester: Secondary | ICD-10-CM

## 2023-11-11 DIAGNOSIS — D649 Anemia, unspecified: Secondary | ICD-10-CM

## 2023-11-11 DIAGNOSIS — Z3480 Encounter for supervision of other normal pregnancy, unspecified trimester: Secondary | ICD-10-CM

## 2023-11-11 DIAGNOSIS — Z3483 Encounter for supervision of other normal pregnancy, third trimester: Secondary | ICD-10-CM

## 2023-11-11 LAB — POCT HEMOGLOBIN: Hemoglobin: 9.9 g/dL — AB (ref 11–14.6)

## 2023-11-11 MED ORDER — FERROUS SULFATE 325 (65 FE) MG PO TABS: 325.0000 mg | ORAL_TABLET | ORAL | 2 refills | Status: DC

## 2023-11-11 NOTE — Patient Instructions (Addendum)
 Zoe Carroll, thank you for choosing our office today! We appreciate the opportunity to meet your healthcare needs. You may receive a short survey by mail, e-mail, or through [employer]. If you are happy with your care we would appreciate if you could take just a few minutes to complete the survey questions. We read all of your comments and take your feedback very seriously. Thank you again for choosing our office.  Center for [employer] Team at [facility]  [facility] at [facility] ([address] [place], [place] [address]) Entrance C, located off of [address] Free 24/7 valet parking   CLASSES: Go to [email or web address] to register for classes (childbirth, breastfeeding, waterbirth, infant CPR, daddy bootcamp, etc.)  Call the office [phone]) or go to [facility] if: You begin to have strong, frequent contractions Your water breaks.  Sometimes it is a big gush of fluid, sometimes it is just a trickle that keeps getting your panties wet or running down your legs You have vaginal bleeding.  It is normal to have a small amount of spotting if your cervix was checked.  You don't feel your baby moving like normal.  If you don't, get you something to eat and drink and lay down and focus on feeling your baby move.   If your baby is still not moving like normal, you should call the office or go to [facility].  Call the office [phone]) or go to [facility] hospital for these signs of pre-eclampsia: Severe headache that does not go away with Tylenol  Visual changes- seeing spots, double, blurred vision Pain under your right breast or upper abdomen that does not go away with Tums or heartburn medicine Nausea and/or vomiting Severe swelling in your hands, feet, and face   Tdap Vaccine It is recommended that you get the Tdap vaccine during the third trimester of EACH pregnancy to help protect your baby from getting pertussis (whooping cough) 27-36 weeks is the BEST time to do  this so that you can pass the protection on to your baby. During pregnancy is better than after pregnancy, but if you are unable to get it during pregnancy it will be offered at the hospital.  You can get this vaccine with us , at the health department, your family doctor, or some local pharmacies Everyone who will be around your baby should also be up-to-date on their vaccines before the baby comes. Adults (who are not pregnant) only need 1 dose of Tdap during adulthood.   [facility] Pediatricians/Family Doctors [facility]): [address] [place], [phone]           [facility]: [address], [phone]                [facility]): [address], [phone] (call to ask if accepting patients) [facility] Department: [address], [place], [phone]    [facility] Pediatricians/Family Doctors [facility]): [phone] [address], [phone] [facility]: [address] [place], [phone] [facility]: [address], [phone]  [facility] Doctors  [facility]): [phone] [facility]: [address], [phone]   [facility] Doctors [facility]: [address], [phone]  [facility] Family Doctors  [facility]: [phone], [phone]  Home Blood Pressure Monitoring for Patients   Your provider has recommended that you check your  blood pressure (BP) at least once a week at home. If you do not have a blood pressure cuff at home, one will be provided for you. Contact your provider if you have not received your monitor within 1 week.   Helpful Tips for Accurate Home Blood Pressure Checks  Don't smoke, exercise, or drink caffeine 30 minutes before checking your BP Use the restroom before checking your BP (a full bladder can raise your  pressure) Relax in a comfortable upright chair Feet on the ground Left arm resting comfortably on a flat surface at the level of your heart Legs uncrossed Back supported Sit quietly and don't talk Place the cuff on your bare arm Adjust snuggly, so that only two fingertips can fit between your skin and the top of the cuff Check 2 readings separated by at least one minute Keep a log of your BP readings For a visual, please reference this diagram: http://ccnc.care/bpdiagram  Provider Name: [facility]     Phone: [phone]  Zone 1: ALL CLEAR  Continue to monitor your symptoms:  BP reading is less than 140 (top number) or less than 90 (bottom number)  No right upper stomach pain No headaches or seeing spots No feeling nauseated or throwing up No swelling in face and hands  Zone 2: CAUTION Call your doctor's office for any of the following:  BP reading is greater than 140 (top number) or greater than 90 (bottom number)  Stomach pain under your ribs in the middle or right side Headaches or seeing spots Feeling nauseated or throwing up Swelling in face and hands  Zone 3: EMERGENCY  Seek immediate medical care if you have any of the following:  BP reading is greater than160 (top number) or greater than 110 (bottom number) Severe headaches not improving with Tylenol  Serious difficulty catching your breath Any worsening symptoms from Zone 2  Preterm Labor and Birth Information  The normal length of a pregnancy is 39-41 weeks. Preterm labor is when labor starts before 37 completed weeks of pregnancy. What are the risk factors for preterm labor? Preterm labor is more likely to occur in women who: Have certain infections during pregnancy such as a bladder infection, sexually transmitted infection, or infection inside the uterus (chorioamnionitis). Have a shorter-than-normal cervix. Have gone into preterm labor before. Have had surgery on their cervix. Are younger than age 62  or older than age 98. Are African American. Are pregnant with twins or multiple babies (multiple gestation). Take street drugs or smoke while pregnant. Do not gain enough weight while pregnant. Became pregnant shortly after having been pregnant. What are the symptoms of preterm labor? Symptoms of preterm labor include: Cramps similar to those that can happen during a menstrual period. The cramps may happen with diarrhea. Pain in the abdomen or lower back. Regular uterine contractions that may feel like tightening of the abdomen. A feeling of increased pressure in the pelvis. Increased watery or bloody mucus discharge from the vagina. Water breaking (ruptured amniotic sac). Why is it important to recognize signs of preterm labor? It is important to recognize signs of preterm labor because babies who are born prematurely may not be fully developed. This can put them at an increased risk for: Long-term (chronic) heart and lung problems. Difficulty immediately after birth with regulating body systems, including blood sugar, body temperature, heart rate, and breathing rate. Bleeding in the brain. Cerebral palsy. Learning difficulties. Death. These risks are highest for babies who are born before 34 weeks  of pregnancy. How is preterm labor treated? Treatment depends on the length of your pregnancy, your condition, and the health of your baby. It may involve: Having a stitch (suture) placed in your cervix to prevent your cervix from opening too early (cerclage). Taking or being given medicines, such as: Hormone medicines. These may be given early in pregnancy to help support the pregnancy. Medicine to stop contractions. Medicines to help mature the baby's lungs. These may be prescribed if the risk of delivery is high. Medicines to prevent your baby from developing cerebral palsy. If the labor happens before 34 weeks of pregnancy, you may need to stay in the hospital. What should I do if I  think I am in preterm labor? If you think that you are going into preterm labor, call your health care provider right away. How can I prevent preterm labor in future pregnancies? To increase your chance of having a full-term pregnancy: Do not use any tobacco products, such as cigarettes, chewing tobacco, and e-cigarettes. If you need help quitting, ask your health care provider. Do not use street drugs or medicines that have not been prescribed to you during your pregnancy. Talk with your health care provider before taking any herbal supplements, even if you have been taking them regularly. Make sure you gain a healthy amount of weight during your pregnancy. Watch for infection. If you think that you might have an infection, get it checked right away. Make sure to tell your health care provider if you have gone into preterm labor before. This information is not intended to replace advice given to you by your health care provider. Make sure you discuss any questions you have with your health care provider. Document Revised: 08/14/2018 Document Reviewed: 09/13/2015 Elsevier Patient Education  2020 [employer].  For Headaches:  Stay well hydrated, drink enough water so that your urine is clear, sometimes if you are dehydrated you can get headaches Eat small frequent meals and snacks, sometimes if you are hungry you can get headaches Sometimes you get headaches during pregnancy from the pregnancy hormones You can try tylenol  (1-2 regular strength 325mg  or 1-2 extra strength 500mg ) as directed on the box. The least amount of medication that works is best.  Cool compresses (cool wet washcloth or ice pack) to area of head that is hurting You can also try drinking a caffeinated drink to see if this will help If not helping, try below:  For Prevention of Headaches/Migraines: CoQ10 100mg  three times daily Vitamin B2 400mg  daily Magnesium Oxide 400-600mg  daily  Foods to alleviate migraines:  1)  dark leafy greens 2) avocado 3) tuna 4) salmon  5) beans and legumes  Foods to avoid: 1) Excessive (or irregular timing) coffee 3) aged cheeses 4) chocolate 5) citrus fruits 6) aspartame and other artifical sweeteners 7) yeast 8) MSG (in processed foods) 9) processed and cured meats 10) nuts and certain seeds 11) chicken livers and other organ meats 12) dairy products like buttermilk, sour cream, and yogurt 13) dried fruits like dates, figs, and raisins 14) garlic 15) onions 16) potato chips 17) pickled foods like olives and sauerkraut 18) some fresh fruits like ripe banana, papaya, red plums, raspberries, kiwi, pineapple 19) tomato-based products  Recommend to keep a migraine diary: rate daily the severity of your headache (1-10) and what foods you eat that day to help determine patterns.   If You Get a Bad Headache/Migraine: Benadryl  25mg   Magnesium Oxide 1 large Gatorade 2 extra strength Tylenol  (1,000mg   total) 1 cup coffee or Coke      If this doesn't help please call us  @ [phone]

## 2023-11-11 NOTE — Progress Notes (Signed)
 LOW-RISK PREGNANCY VISIT Patient name: Zoe Carroll MRN [id number]  Date of birth: 23-Jul-2005 Chief Complaint:   Routine Prenatal Visit  History of Present Illness:   Zoe Carroll is a 18 y.o. G48P1001 female at [redacted]w[redacted]d with an Estimated Date of Delivery: 12/25/23 being seen today for ongoing management of a low-risk pregnancy.   Today she reports migraines like last pregnancy. Craving baby powder, chalk-but not eating it. Contractions: Not present.  .  Movement: Present. denies leaking of fluid.     06/11/2023    3:32 PM 11/01/2021    4:23 PM  Depression screen PHQ 2/9  Decreased Interest 0 0  Down, Depressed, Hopeless 0 0  PHQ - 2 Score 0 0  Altered sleeping 0 0  Tired, decreased energy 1 1  Change in appetite 1 0  Feeling bad or failure about yourself  0 0  Trouble concentrating 0 0  Moving slowly or fidgety/restless 0 0  Suicidal thoughts 0 0  PHQ-9 Score 2 1        06/11/2023    3:32 PM 11/01/2021    4:23 PM  GAD 7 : Generalized Anxiety Score  Nervous, Anxious, on Edge 0 0  Control/stop worrying 0 0  Worry too much - different things 0 0  Trouble relaxing 0 0  Restless 0 0  Easily annoyed or irritable 0 0  Afraid - awful might happen 0 0  Total GAD 7 Score 0 0      Review of Systems:   Pertinent items are noted in HPI Denies abnormal vaginal discharge w/ itching/odor/irritation, headaches, visual changes, shortness of breath, chest pain, abdominal pain, severe nausea/vomiting, or problems with urination or bowel movements unless otherwise stated above. Pertinent History Reviewed:  Reviewed past medical,surgical, social, obstetrical and family history.  Reviewed problem list, medications and allergies. Physical Assessment:   Vitals:   11/11/23 1605  BP: 121/64  Pulse: 86  Weight: 176 lb 9.6 oz (80.1 kg)  Body mass index is 27.66 kg/m.        Physical Examination:   General appearance: Well appearing, and in no distress  Mental status: Alert, oriented to  person, place, and time  Skin: Warm & dry  Cardiovascular: Normal heart rate noted  Respiratory: Normal respiratory effort, no distress  Abdomen: Soft, gravid, nontender  Pelvic: Cervical exam deferred         Extremities: Edema: Trace  Fetal Status: Fetal Heart Rate (bpm): 142 Fundal Height: 31 cm Movement: Present    Chaperone: N/A Results for orders placed or performed in visit on 11/11/23 (from the past 24 hours)  POCT hemoglobin   Collection Time: 11/11/23  4:44 PM  Result Value Ref Range   Hemoglobin 9.9 (A) 11 - 14.6 g/dL    Assessment & Plan:  1) Low-risk pregnancy G2P1001 at [redacted]w[redacted]d with an Estimated Date of Delivery: 12/25/23   2) Headaches/migraines, gave printed prevention/relief measures, if not helping let us  know  3) 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    Dispense:  45 tablet    Refill:  2   Labs/procedures today: fingerstick hgb  Plan:  Continue routine obstetrical care  Next visit: prefers in person  Reviewed: Preterm labor symptoms and general obstetric precautions including but not limited to vaginal bleeding, contractions, leaking of fluid and fetal movement were reviewed in detail with the patient.  All questions were answered. Does have home bp cuff. Office bp cuff given: not applicable. Check bp weekly, let us  know if consistently >140 and/or >90.  Follow-up: Return for As scheduled then weekly.  Future Appointments  Date Time Provider Department Center  11/25/2023  3:50 PM Kizzie Suzen SAUNDERS, [place] [facility]  12/02/2023  3:50 PM Kizzie Suzen SAUNDERS, CNM [facility]  12/10/2023  4:10 PM Loreli Suzen BIRCH, CNM [facility]  12/16/2023  3:50 PM Kizzie Suzen SAUNDERS, CNM [facility]  12/24/2023   4:10 PM Loreli Suzen BIRCH, CNM [facility]    Orders Placed This Encounter  Procedures   CBC   POCT hemoglobin   Suzen SAUNDERS Kizzie CNM, [facility] 11/11/2023 4:47 PM

## 2023-11-12 ENCOUNTER — Ambulatory Visit: Payer: Self-pay | Admitting: Women's Health

## 2023-11-12 LAB — CBC
Hematocrit: 38 % (ref 34.0–46.6)
Hemoglobin: 12.8 g/dL (ref 11.1–15.9)
MCH: 28.4 pg (ref 26.6–33.0)
MCHC: 33.7 g/dL (ref 31.5–35.7)
MCV: 84 fL (ref 79–97)
Platelets: 291 x10E3/uL (ref 150–450)
RBC: 4.51 x10E6/uL (ref 3.77–5.28)
RDW: 12.4 % (ref 11.7–15.4)
WBC: 12.5 x10E3/uL — ABNORMAL HIGH (ref 3.4–10.8)

## 2023-11-25 ENCOUNTER — Encounter: Admitting: Women's Health

## 2023-11-27 ENCOUNTER — Other Ambulatory Visit (HOSPITAL_COMMUNITY)
Admission: RE | Admit: 2023-11-27 | Discharge: 2023-11-27 | Disposition: A | Discharge status: Home / Self Care | Source: Ambulatory Visit | Attending: Advanced Practice Midwife | Admitting: Advanced Practice Midwife

## 2023-11-27 ENCOUNTER — Encounter: Payer: Self-pay | Admitting: Advanced Practice Midwife

## 2023-11-27 ENCOUNTER — Ambulatory Visit (INDEPENDENT_AMBULATORY_CARE_PROVIDER_SITE_OTHER): Admitting: Advanced Practice Midwife

## 2023-11-27 VITALS — BP 132/72 | HR 93 | Wt 176.0 lb

## 2023-11-27 DIAGNOSIS — Z3A36 36 weeks gestation of pregnancy: Secondary | ICD-10-CM | POA: Insufficient documentation

## 2023-11-27 DIAGNOSIS — Z3483 Encounter for supervision of other normal pregnancy, third trimester: Secondary | ICD-10-CM | POA: Diagnosis not present

## 2023-11-27 DIAGNOSIS — Z3480 Encounter for supervision of other normal pregnancy, unspecified trimester: Secondary | ICD-10-CM

## 2023-11-27 DIAGNOSIS — F5089 Other specified eating disorder: Secondary | ICD-10-CM | POA: Diagnosis not present

## 2023-11-27 LAB — OB RESULTS CONSOLE GBS: GBS: NEGATIVE

## 2023-11-27 NOTE — Patient Instructions (Signed)

## 2023-11-27 NOTE — Progress Notes (Addendum)
   LOW-RISK PREGNANCY VISIT Patient name: Zoe Carroll MRN [id number]  Date of birth: [birth year] Chief Complaint:   Routine Prenatal Visit  History of Present Illness:   Zoe Carroll is a 18 y.o. G59P1001 female at [redacted]w[redacted]d with an Estimated Date of Delivery: 12/25/23 being seen today for ongoing management of a low-risk pregnancy.  Today she reports fatigue related to not sleeping well last night. Contractions: Irregular. Vag. Bleeding: None.  Movement: Present. Denies leaking of fluid. Review of Systems:   Pertinent items are noted in HPI Denies abnormal vaginal discharge w/ itching/odor/irritation, headaches, visual changes, shortness of breath, chest pain, abdominal pain, severe nausea/vomiting, or problems with urination or bowel movements unless otherwise stated above. Pertinent History Reviewed:  Reviewed past medical,surgical, social, obstetrical and family history.  Reviewed problem list, medications and allergies. Physical Assessment:   Vitals:   11/27/23 1152  BP: 132/72  Pulse: 93  Weight: 79.8 kg  Body mass index is 27.57 kg/m.        Physical Examination:   General appearance: Well appearing, and in no distress  Mental status: Alert, oriented to person, place, and time  Skin: Warm & dry  Cardiovascular: Normal heart rate noted  Respiratory: Normal respiratory effort, no distress  Abdomen: Soft, gravid, nontender  Pelvic: Cervical exam deferred         Extremities: Edema: None Chaperone:  Latisha Cresenzo   Fetal Status: Fetal Heart Rate (bpm): 130 Fundal Height: 35 cm Movement: Present      No results found for this or any previous visit (from the past 24 hours).  Assessment & Plan:    Pregnancy: G2P1001 at [redacted]w[redacted]d 1. [redacted] weeks gestation of pregnancy (Primary) Vaginal swabs collected - Cervicovaginal ancillary only - Strep Gp B NAA+Rflx  2. Supervision of other normal pregnancy, antepartum Feeling well overall other than increased fatigue after not sleeping  well No concerns today  3. Pica Continued craving for corn starch but not eating  Hgb 12.8 at last visit (11/11/23)    Meds: No orders of the defined types were placed in this encounter.  Labs/procedures today: GC/CT+GBS  Plan:  Continue routine obstetrical care, begin weekly visits Next visit: In-person    Reviewed: Preterm labor symptoms and general obstetric precautions including but not limited to vaginal bleeding, contractions, leaking of fluid and fetal movement were reviewed in detail with the patient.  All questions were answered.   Follow-up: No follow-ups on file.  Future Appointments  Date Time Provider Department Center  12/02/2023  3:50 PM Kizzie Suzen [place], [place] [facility]  12/10/2023  4:10 PM Loreli Suzen JONETTA, CNM [facility]  12/16/2023  3:50 PM Kizzie Suzen SAUNDERS, CNM [facility]  12/24/2023  4:10 PM Loreli Suzen JONETTA, CNM [facility]    Orders Placed This Encounter  Procedures   Strep Gp B NAA+Rflx   Vernell FORBES [place], [facility] 11/27/2023 12:23 PM  The above was performed under my direct supervision and guidance.

## 2023-11-28 LAB — CERVICOVAGINAL ANCILLARY ONLY
Chlamydia: NEGATIVE
Comment: NEGATIVE
Comment: NORMAL
Neisseria Gonorrhea: NEGATIVE

## 2023-11-29 LAB — STREP GP B NAA+RFLX: Strep Gp B NAA+Rflx: NEGATIVE

## 2023-12-02 ENCOUNTER — Encounter: Payer: Self-pay | Admitting: Women's Health

## 2023-12-02 ENCOUNTER — Ambulatory Visit: Admitting: Women's Health

## 2023-12-02 VITALS — BP 113/70 | HR 88 | Wt 177.0 lb

## 2023-12-02 DIAGNOSIS — Z3480 Encounter for supervision of other normal pregnancy, unspecified trimester: Secondary | ICD-10-CM

## 2023-12-02 DIAGNOSIS — Z3A36 36 weeks gestation of pregnancy: Secondary | ICD-10-CM | POA: Diagnosis not present

## 2023-12-02 DIAGNOSIS — O26843 Uterine size-date discrepancy, third trimester: Secondary | ICD-10-CM

## 2023-12-02 DIAGNOSIS — Z3483 Encounter for supervision of other normal pregnancy, third trimester: Secondary | ICD-10-CM

## 2023-12-02 NOTE — Patient Instructions (Addendum)
 Zoe Carroll, thank you for choosing our office today! We appreciate the opportunity to meet your healthcare needs. You may receive a short survey by mail, e-mail, or through [employer]. If you are happy with your care we would appreciate if you could take just a few minutes to complete the survey questions. We read all of your comments and take your feedback very seriously. Thank you again for choosing our office.  Center for [employer] Team at [facility]  [facility] at [facility] ([address] [place], [place] [address]) Entrance C, located off of [address] Free 24/7 valet parking   For your lower back pain you may: Purchase a pregnancy/maternity support belt from [employer], [employer], [employer], [employer], etc and wear it while you are up and about Take warm baths Use a heating pad to your lower back for no longer than 20 minutes at a time, and do not place near abdomen Take tylenol  as needed. Please follow directions on the bottle Kinesiology tape (can get from sporting goods store), google how to tape belly for pregnancy   CLASSES: Go to [email or web address] to register for classes (childbirth, breastfeeding, waterbirth, infant CPR, daddy bootcamp, etc.)  Call the office [phone]) or go to [facility] if: You begin to have strong, frequent contractions Your water breaks.  Sometimes it is a big gush of fluid, sometimes it is just a trickle that keeps getting your panties wet or running down your legs You have vaginal bleeding.  It is normal to have a small amount of spotting if your cervix was checked.  You don't feel your baby moving like normal.  If you don't, get you something to eat and drink and lay down and focus on feeling your baby move.   If your baby is still not moving like normal, you should call the office or go to [facility].  Call the office [phone]) or go to [facility] hospital for these signs of pre-eclampsia: Severe headache that does  not go away with Tylenol  Visual changes- seeing spots, double, blurred vision Pain under your right breast or upper abdomen that does not go away with Tums or heartburn medicine Nausea and/or vomiting Severe swelling in your hands, feet, and face   [place] [facility]/Family Doctors [facility]): [address] Luba BROCKS, [phone]           [facility]: [address], [phone]                [facility]): [address], [phone] (call to ask if accepting patients) [facility] Department: [address] [place], [phone]    [facility] Pediatricians/Family Doctors [facility]): [address], [phone] [facility]: [address] [place], [phone] [facility]: [address], [phone]  [facility] Doctors  [facility]): [phone] [facility]: [address] [phone]   [facility] Doctors [facility]: [address] [phone]  [facility] Doctors  [facility]: [phone], [phone]  Home Blood Pressure Monitoring for Patients   Your provider has recommended that you check your blood pressure (BP) at least once a week at home. If you do not have a blood pressure cuff at home, one will be provided for you. Contact your provider if you have not received your monitor within 1 week.   Helpful  Tips for Accurate Home Blood Pressure Checks  Don't smoke, exercise, or drink caffeine 30 minutes before checking your BP Use the restroom before checking your BP (a full bladder can raise your pressure) Relax in a comfortable upright chair Feet on the ground Left arm resting comfortably on a flat surface at the level of your heart Legs uncrossed Back supported Sit quietly and don't talk Place the cuff on your  bare arm Adjust snuggly, so that only two fingertips can fit between your skin and the top of the cuff Check 2 readings separated by at least one minute Keep a log of your BP readings For a visual, please reference this diagram: http://ccnc.care/bpdiagram  Provider Name: [facility]     Phone: [phone]  Zone 1: ALL CLEAR  Continue to monitor your symptoms:  BP reading is less than 140 (top number) or less than 90 (bottom number)  No right upper stomach pain No headaches or seeing spots No feeling nauseated or throwing up No swelling in face and hands  Zone 2: CAUTION Call your doctor's office for any of the following:  BP reading is greater than 140 (top number) or greater than 90 (bottom number)  Stomach pain under your ribs in the middle or right side Headaches or seeing spots Feeling nauseated or throwing up Swelling in face and hands  Zone 3: EMERGENCY  Seek immediate medical care if you have any of the following:  BP reading is greater than160 (top number) or greater than 110 (bottom number) Severe headaches not improving with Tylenol  Serious difficulty catching your breath Any worsening symptoms from Zone 2   Braxton Hicks Contractions Contractions of the uterus can occur throughout pregnancy, but they are not always a sign that you are in labor. You may have practice contractions called Braxton Hicks contractions. These false labor contractions are sometimes confused with true labor. What are Zoe Carroll contractions? Braxton Hicks contractions are tightening movements that occur in the muscles of the uterus before labor. Unlike true labor contractions, these contractions do not result in opening (dilation) and thinning of the cervix. Toward the end of pregnancy (32-34 weeks), Braxton Hicks contractions can happen more often and may become stronger. These contractions are sometimes difficult to tell apart from true labor because they can be very uncomfortable.  You should not feel embarrassed if you go to the hospital with false labor. Sometimes, the only way to tell if you are in true labor is for your health care provider to look for changes in the cervix. The health care provider will do a physical exam and may monitor your contractions. If you are not in true labor, the exam should show that your cervix is not dilating and your water has not broken. If there are no other health problems associated with your pregnancy, it is completely safe for you to be sent home with false labor. You may continue to have Braxton Hicks contractions until you go into true labor. How to tell the difference between true labor and false labor True labor Contractions last 30-70 seconds. Contractions become very regular. Discomfort is usually felt in the top of the uterus, and it spreads to the lower abdomen and low back. Contractions do not go away with walking. Contractions usually become more intense and increase in frequency. The cervix dilates and gets thinner. False labor Contractions are usually shorter and not as strong as true labor contractions. Contractions are usually irregular. Contractions are often felt in the front of  the lower abdomen and in the groin. Contractions may go away when you walk around or change positions while lying down. Contractions get weaker and are shorter-lasting as time goes on. The cervix usually does not dilate or become thin. Follow these instructions at home:  Take over-the-counter and prescription medicines only as told by your health care provider. Keep up with your usual exercises and follow other instructions from your health care provider. Eat and drink lightly if you think you are going into labor. If Braxton Hicks contractions are making you uncomfortable: Change your position from lying down or resting to walking, or change from walking to resting. Sit and rest in a tub of warm water. Drink enough fluid to keep your  urine pale yellow. Dehydration may cause these contractions. Do slow and deep breathing several times an hour. Keep all follow-up prenatal visits as told by your health care provider. This is important. Contact a health care provider if: You have a fever. You have continuous pain in your abdomen. Get help right away if: Your contractions become stronger, more regular, and closer together. You have fluid leaking or gushing from your vagina. You pass blood-tinged mucus (bloody show). You have bleeding from your vagina. You have low back pain that you never had before. You feel your baby's head pushing down and causing pelvic pressure. Your baby is not moving inside you as much as it used to. Summary Contractions that occur before labor are called Braxton Hicks contractions, false labor, or practice contractions. Braxton Hicks contractions are usually shorter, weaker, farther apart, and less regular than true labor contractions. True labor contractions usually become progressively stronger and regular, and they become more frequent. Manage discomfort from [facility] contractions by changing position, resting in a warm bath, drinking plenty of water, or practicing deep breathing. This information is not intended to replace advice given to you by your health care provider. Make sure you discuss any questions you have with your health care provider. Document Revised: 04/04/2017 Document Reviewed: 09/05/2016 Elsevier Patient Education  2020 [employer].

## 2023-12-02 NOTE — Progress Notes (Signed)
 LOW-RISK PREGNANCY VISIT Patient name: Zoe Carroll MRN [id number]  Date of birth: 19-Apr-2006 Chief Complaint:   Routine Prenatal Visit  History of Present Illness:   Zoe Carroll is a 18 y.o. G90P1001 female at [redacted]w[redacted]d with an Estimated Date of Delivery: 12/25/23 being seen today for ongoing management of a low-risk pregnancy.   Today she reports trouble sleeping, low back pain, and occasional contractions. Contractions: Irregular.  .  Movement: Present. denies leaking of fluid.     06/11/2023    3:32 PM 11/01/2021    4:23 PM  Depression screen PHQ 2/9  Decreased Interest 0 0  Down, Depressed, Hopeless 0 0  PHQ - 2 Score 0 0  Altered sleeping 0 0  Tired, decreased energy 1 1  Change in appetite 1 0  Feeling bad or failure about yourself  0 0  Trouble concentrating 0 0  Moving slowly or fidgety/restless 0 0  Suicidal thoughts 0 0  PHQ-9 Score 2 1        06/11/2023    3:32 PM 11/01/2021    4:23 PM  GAD 7 : Generalized Anxiety Score  Nervous, Anxious, on Edge 0 0  Control/stop worrying 0 0  Worry too much - different things 0 0  Trouble relaxing 0 0  Restless 0 0  Easily annoyed or irritable 0 0  Afraid - awful might happen 0 0  Total GAD 7 Score 0 0      Review of Systems:   Pertinent items are noted in HPI Denies abnormal vaginal discharge w/ itching/odor/irritation, headaches, visual changes, shortness of breath, chest pain, abdominal pain, severe nausea/vomiting, or problems with urination or bowel movements unless otherwise stated above. Pertinent History Reviewed:  Reviewed past medical,surgical, social, obstetrical and family history.  Reviewed problem list, medications and allergies. Physical Assessment:   Vitals:   12/02/23 1606  BP: 113/70  Pulse: 88  Weight: 177 lb (80.3 kg)  Body mass index is 27.72 kg/m.        Physical Examination:   General appearance: Well appearing, and in no distress  Mental status: Alert, oriented to person, place, and  time  Skin: Warm & dry  Cardiovascular: Normal heart rate noted  Respiratory: Normal respiratory effort, no distress  Abdomen: Soft, gravid, nontender  Pelvic: Cervical exam deferred         Extremities: Edema: None  Fetal Status: Fetal Heart Rate (bpm): 145 Fundal Height: 30 cm Movement: Present    Chaperone: N/A No results found for this or any previous visit (from the past 24 hours).  Assessment & Plan:  1) Low-risk pregnancy G2P1001 at [redacted]w[redacted]d with an Estimated Date of Delivery: 12/25/23   2) S<D, will get EFW u/s asap  3) Low back pain> discussed and gave printed prevention/relief measures   4) Trouble sleeping> can try tylenol  pm or benadryl  if needed   Meds: No orders of the defined types were placed in this encounter.  Labs/procedures today: none  Plan:  Continue routine obstetrical care  Next visit: prefers in person    Reviewed: Preterm labor symptoms and general obstetric precautions including but not limited to vaginal bleeding, contractions, leaking of fluid and fetal movement were reviewed in detail with the patient.  All questions were answered. Does have home bp cuff. Office bp cuff given: not applicable. Check bp weekly, let us  know if consistently >140 and/or >90.  Follow-up: Return for As scheduled- add EFW u/s asap please.  Future Appointments  Date  Time Provider Department Center  12/10/2023  4:10 PM Loreli Suzen BIRCH, CNM [facility]  12/16/2023  3:50 PM Kizzie Suzen SAUNDERS, CNM [facility]  12/24/2023  4:10 PM Loreli Suzen BIRCH, CNM [facility]    Orders Placed This Encounter  Procedures   US  OB Follow Up   Suzen SAUNDERS Kizzie CNM, [facility] 12/02/2023 4:27 PM

## 2023-12-10 ENCOUNTER — Encounter: Admitting: Advanced Practice Midwife

## 2023-12-12 ENCOUNTER — Ambulatory Visit: Admitting: Obstetrics and Gynecology

## 2023-12-12 ENCOUNTER — Ambulatory Visit

## 2023-12-12 VITALS — BP 129/80 | HR 99 | Wt 181.4 lb

## 2023-12-12 DIAGNOSIS — O26843 Uterine size-date discrepancy, third trimester: Secondary | ICD-10-CM

## 2023-12-12 DIAGNOSIS — Z3A38 38 weeks gestation of pregnancy: Secondary | ICD-10-CM

## 2023-12-12 DIAGNOSIS — Z3480 Encounter for supervision of other normal pregnancy, unspecified trimester: Secondary | ICD-10-CM

## 2023-12-12 DIAGNOSIS — Z3483 Encounter for supervision of other normal pregnancy, third trimester: Secondary | ICD-10-CM

## 2023-12-12 NOTE — Progress Notes (Signed)
 US  38+1 wks,cephalic,FHR 138 bpm,posterior placenta gr 3,AFI 14 cm,EFW 3224 G 46%

## 2023-12-12 NOTE — Progress Notes (Signed)
   PRENATAL VISIT NOTE  Subjective:  Zoe Carroll is a 18 y.o. G2P1001 at [redacted]w[redacted]d being seen today for ongoing prenatal care.  She is currently monitored for the following issues for this low-risk pregnancy and has Sensorineural hearing loss and Encounter for supervision of normal pregnancy, antepartum on their problem list.  Patient reports lower abdominal cramping.  Contractions: Irritability. Vag. Bleeding: None.  Movement: Present. Denies leaking of fluid.   The following portions of the patient's history were reviewed and updated as appropriate: allergies, current medications, past family history, past medical history, past social history, past surgical history and problem list.   Objective:    Vitals:   12/12/23 1051  BP: 129/80  Pulse: 99  Weight: 82.3 kg    Fetal Status:    Fundal Height: 36 cm Movement: Present  FHR on US : 138  General: Alert, oriented and cooperative. Patient is in no acute distress.  Skin: Skin is warm and dry. No rash noted.   Cardiovascular: Normal heart rate noted  Respiratory: Normal respiratory effort, no problems with respiration noted  Abdomen: Soft, gravid, appropriate for gestational age.  Pain/Pressure: Absent     Pelvic: Cervical exam deferred        Extremities: Normal range of motion.     Mental Status: Normal mood and affect. Normal behavior. Normal judgment and thought content.   Assessment and Plan:  Pregnancy: G2P1001 at [redacted]w[redacted]d  1. Supervision of other normal pregnancy, antepartum (Primary) No concerns today, feeling regular fetal movement   2. [redacted] weeks gestation of pregnancy   3. Uterine size-date discrepancy in third trimester Fundal height discrepancy at last OB visit  Growth US  today: 3224 g, 46% EFW    Term labor symptoms and general obstetric precautions including but not limited to vaginal bleeding, contractions, leaking of fluid and fetal movement were reviewed in detail with the patient. Please refer to After Visit  Summary for other counseling recommendations.   No follow-ups on file.  Future Appointments  Date Time Provider Department Center  12/16/2023  3:50 PM Kizzie Suzen SAUNDERS, [place] [facility]  12/24/2023  4:10 PM Loreli Suzen JONETTA, CNM [facility]    Vernell FORBES Ruddle, Student-MidWife 12/12/23 1120

## 2023-12-16 ENCOUNTER — Ambulatory Visit: Admitting: Women's Health

## 2023-12-16 ENCOUNTER — Encounter: Payer: Self-pay | Admitting: Women's Health

## 2023-12-16 VITALS — BP 134/77 | HR 102 | Wt 181.4 lb

## 2023-12-16 DIAGNOSIS — Z3483 Encounter for supervision of other normal pregnancy, third trimester: Secondary | ICD-10-CM

## 2023-12-16 DIAGNOSIS — Z3A38 38 weeks gestation of pregnancy: Secondary | ICD-10-CM | POA: Diagnosis not present

## 2023-12-16 NOTE — Patient Instructions (Signed)
 Zoe Carroll, thank you for choosing our office today! We appreciate the opportunity to meet your healthcare needs. You may receive a short survey by mail, e-mail, or through [employer]. If you are happy with your care we would appreciate if you could take just a few minutes to complete the survey questions. We read all of your comments and take your feedback very seriously. Thank you again for choosing our office.  Center for [employer] Team at [facility]  [facility] at [facility] ([address] [place], [place] [address]) Entrance C, located off of [address] Free 24/7 valet parking   CLASSES: Go to [email or web address] to register for classes (childbirth, breastfeeding, waterbirth, infant CPR, daddy bootcamp, etc.)  Call the office [phone]) or go to [facility] if: You begin to have strong, frequent contractions Your water breaks.  Sometimes it is a big gush of fluid, sometimes it is just a trickle that keeps getting your panties wet or running down your legs You have vaginal bleeding.  It is normal to have a small amount of spotting if your cervix was checked.  You don't feel your baby moving like normal.  If you don't, get you something to eat and drink and lay down and focus on feeling your baby move.   If your baby is still not moving like normal, you should call the office or go to [facility].  Call the office [phone]) or go to [facility] hospital for these signs of pre-eclampsia: Severe headache that does not go away with Tylenol  Visual changes- seeing spots, double, blurred vision Pain under your right breast or upper abdomen that does not go away with Tums or heartburn medicine Nausea and/or vomiting Severe swelling in your hands, feet, and face   [place] [facility]/Family Doctors [facility]): [address] [place], [phone]           [facility]: [address], [phone]                 [facility]): [address], [phone] (call to ask if accepting patients) [facility] Department: [address] [place], [phone]    [facility] Pediatricians/Family Doctors [facility]): [address], [phone] [facility]: [address] [place], [phone] [facility]: [address], [phone]  [facility] Doctors  [facility]): [phone] [facility]: [address], [phone]   [facility] Doctors [facility]: [address], [phone]  [facility] Doctors  [facility]: [phone], [phone]  Home Blood Pressure Monitoring for Patients   Your provider has recommended that you check your blood pressure (BP) at least once a week at home. If you do not have a blood pressure cuff at home, one will be provided for you. Contact your provider if you have not received your monitor within 1 week.   Helpful Tips for Accurate Home Blood Pressure Checks  Don't smoke, exercise, or drink caffeine 30 minutes before checking your BP Use the restroom before checking your BP (a full bladder can raise your pressure) Relax in a comfortable upright chair Feet on the ground Left arm resting comfortably on a flat surface at the level of your heart Legs uncrossed Back supported Sit quietly and don't talk Place the cuff on your bare arm Adjust snuggly, so that only two fingertips  can fit between your skin and the top of the cuff Check 2 readings separated by at least one minute Keep a log of your BP readings For a visual, please reference this diagram: http://ccnc.care/bpdiagram  Provider Name: [facility]     Phone: [phone]  Zone 1: ALL CLEAR  Continue to monitor your symptoms:  BP reading is less than 140 (top number) or less than 90 (bottom number)  No right  upper stomach pain No headaches or seeing spots No feeling nauseated or throwing up No swelling in face and hands  Zone 2: CAUTION Call your doctor's office for any of the following:  BP reading is greater than 140 (top number) or greater than 90 (bottom number)  Stomach pain under your ribs in the middle or right side Headaches or seeing spots Feeling nauseated or throwing up Swelling in face and hands  Zone 3: EMERGENCY  Seek immediate medical care if you have any of the following:  BP reading is greater than160 (top number) or greater than 110 (bottom number) Severe headaches not improving with Tylenol  Serious difficulty catching your breath Any worsening symptoms from Zone 2   Braxton Hicks Contractions Contractions of the uterus can occur throughout pregnancy, but they are not always a sign that you are in labor. You may have practice contractions called Braxton Hicks contractions. These false labor contractions are sometimes confused with true labor. What are Darol Irving contractions? Braxton Hicks contractions are tightening movements that occur in the muscles of the uterus before labor. Unlike true labor contractions, these contractions do not result in opening (dilation) and thinning of the cervix. Toward the end of pregnancy (32-34 weeks), Braxton Hicks contractions can happen more often and may become stronger. These contractions are sometimes difficult to tell apart from true labor because they can be very uncomfortable. You should not feel embarrassed if you go to the hospital with false labor. Sometimes, the only way to tell if you are in true labor is for your health care provider to look for changes in the cervix. The health care provider will do a physical exam and may monitor your contractions. If you are not in true labor, the exam should show that your cervix is not dilating and your water has not broken. If there are no other health problems associated with your  pregnancy, it is completely safe for you to be sent home with false labor. You may continue to have Braxton Hicks contractions until you go into true labor. How to tell the difference between true labor and false labor True labor Contractions last 30-70 seconds. Contractions become very regular. Discomfort is usually felt in the top of the uterus, and it spreads to the lower abdomen and low back. Contractions do not go away with walking. Contractions usually become more intense and increase in frequency. The cervix dilates and gets thinner. False labor Contractions are usually shorter and not as strong as true labor contractions. Contractions are usually irregular. Contractions are often felt in the front of the lower abdomen and in the groin. Contractions may go away when you walk around or change positions while lying down. Contractions get weaker and are shorter-lasting as time goes on. The cervix usually does not dilate or become thin. Follow these instructions at home:  Take over-the-counter and prescription medicines only as told by your health care provider. Keep up with your usual exercises and follow other instructions from your health care provider. Eat and drink lightly if you think  you are going into labor. If Braxton Hicks contractions are making you uncomfortable: Change your position from lying down or resting to walking, or change from walking to resting. Sit and rest in a tub of warm water. Drink enough fluid to keep your urine pale yellow. Dehydration may cause these contractions. Do slow and deep breathing several times an hour. Keep all follow-up prenatal visits as told by your health care provider. This is important. Contact a health care provider if: You have a fever. You have continuous pain in your abdomen. Get help right away if: Your contractions become stronger, more regular, and closer together. You have fluid leaking or gushing from your vagina. You pass  blood-tinged mucus (bloody show). You have bleeding from your vagina. You have low back pain that you never had before. You feel your baby's head pushing down and causing pelvic pressure. Your baby is not moving inside you as much as it used to. Summary Contractions that occur before labor are called Braxton Hicks contractions, false labor, or practice contractions. Braxton Hicks contractions are usually shorter, weaker, farther apart, and less regular than true labor contractions. True labor contractions usually become progressively stronger and regular, and they become more frequent. Manage discomfort from [facility] contractions by changing position, resting in a warm bath, drinking plenty of water, or practicing deep breathing. This information is not intended to replace advice given to you by your health care provider. Make sure you discuss any questions you have with your health care provider. Document Revised: 04/04/2017 Document Reviewed: 09/05/2016 Elsevier Patient Education  2020 [employer].

## 2023-12-16 NOTE — Progress Notes (Signed)
 LOW-RISK PREGNANCY VISIT Patient name: Zoe Carroll MRN [id number]  Date of birth: Jun 20, 2005 Chief Complaint:   Routine Prenatal Visit  History of Present Illness:   Zoe Carroll is a 18 y.o. G58P1001 female at [redacted]w[redacted]d with an Estimated Date of Delivery: 12/25/23 being seen today for ongoing management of a low-risk pregnancy.   Today she reports uncomfortable at night, feels like pelvis is pulling apart. Contractions: Not present. Vag. Bleeding: None.  Movement: Present. denies leaking of fluid.     06/11/2023    3:32 PM 11/01/2021    4:23 PM  Depression screen PHQ 2/9  Decreased Interest 0 0  Down, Depressed, Hopeless 0 0  PHQ - 2 Score 0 0  Altered sleeping 0 0  Tired, decreased energy 1 1  Change in appetite 1 0  Feeling bad or failure about yourself  0 0  Trouble concentrating 0 0  Moving slowly or fidgety/restless 0 0  Suicidal thoughts 0 0  PHQ-9 Score 2 1        06/11/2023    3:32 PM 11/01/2021    4:23 PM  GAD 7 : Generalized Anxiety Score  Nervous, Anxious, on Edge 0 0  Control/stop worrying 0 0  Worry too much - different things 0 0  Trouble relaxing 0 0  Restless 0 0  Easily annoyed or irritable 0 0  Afraid - awful might happen 0 0  Total GAD 7 Score 0 0      Review of Systems:   Pertinent items are noted in HPI Denies abnormal vaginal discharge w/ itching/odor/irritation, headaches, visual changes, shortness of breath, chest pain, abdominal pain, severe nausea/vomiting, or problems with urination or bowel movements unless otherwise stated above. Pertinent History Reviewed:  Reviewed past medical,surgical, social, obstetrical and family history.  Reviewed problem list, medications and allergies. Physical Assessment:   Vitals:   12/16/23 1600  BP: 134/77  Pulse: (!) 102  Weight: 181 lb 6.4 oz (82.3 kg)  Body mass index is 28.41 kg/m.        Physical Examination:   General appearance: Well appearing, and in no distress  Mental status: Alert,  oriented to person, place, and time  Skin: Warm & dry  Cardiovascular: Normal heart rate noted  Respiratory: Normal respiratory effort, no distress  Abdomen: Soft, gravid, nontender  Pelvic: Cervical exam performed  Dilation: 4 Effacement (%): 50 Station: -2  Extremities: Edema: None  Fetal Status: Fetal Heart Rate (bpm): 143 Fundal Height: 31 cm Movement: Present Presentation: Vertex  Chaperone: Alan Fischer No results found for this or any previous visit (from the past 24 hours).  Assessment & Plan:  1) Low-risk pregnancy G2P1001 at [redacted]w[redacted]d with an Estimated Date of Delivery: 12/25/23   2) S<D, EFW 46%/AFI nl last week   Meds: No orders of the defined types were placed in this encounter.  Labs/procedures today: SVE  Plan:  Continue routine obstetrical care  Next visit: prefers in person    Reviewed: Term labor symptoms and general obstetric precautions including but not limited to vaginal bleeding, contractions, leaking of fluid and fetal movement were reviewed in detail with the patient.  All questions were answered. Does have home bp cuff. Office bp cuff given: not applicable. Check bp weekly, let us  know if consistently >140 and/or >90.  Follow-up: Return for As scheduled.  Future Appointments  Date Time Provider Department Center  12/24/2023  4:10 PM Loreli Suzen JONETTA, CNM [facility]    No orders of  the defined types were placed in this encounter.  Suzen JONELLE Fetters CNM, [facility] 12/16/2023 4:10 PM

## 2023-12-24 ENCOUNTER — Encounter: Payer: Self-pay | Admitting: Advanced Practice Midwife

## 2023-12-24 ENCOUNTER — Ambulatory Visit: Admitting: Advanced Practice Midwife

## 2023-12-24 VITALS — BP 117/67 | HR 84 | Wt 182.0 lb

## 2023-12-24 DIAGNOSIS — Z3483 Encounter for supervision of other normal pregnancy, third trimester: Secondary | ICD-10-CM | POA: Diagnosis not present

## 2023-12-24 DIAGNOSIS — Z3480 Encounter for supervision of other normal pregnancy, unspecified trimester: Secondary | ICD-10-CM

## 2023-12-24 DIAGNOSIS — Z3A39 39 weeks gestation of pregnancy: Secondary | ICD-10-CM

## 2023-12-24 NOTE — Progress Notes (Unsigned)
   LOW-RISK PREGNANCY VISIT Patient name: Zoe Carroll MRN [id number]  Date of birth: 2005/08/09 Chief Complaint:   Routine Prenatal Visit (Cervical check)  History of Present Illness:   BRIENA SWINGLER is a 18 y.o. G13P1001 female at [redacted]w[redacted]d with an Estimated Date of Delivery: 12/25/23 being seen today for ongoing management of a low-risk pregnancy.  Today she reports being ready for labor, but doesn't want induction if possible. Contractions: Irregular.  .  Movement: Present. denies leaking of fluid. Review of Systems:   Pertinent items are noted in HPI Denies abnormal vaginal discharge w/ itching/odor/irritation, headaches, visual changes, shortness of breath, chest pain, abdominal pain, severe nausea/vomiting, or problems with urination or bowel movements unless otherwise stated above. Pertinent History Reviewed:  Reviewed past medical,surgical, social, obstetrical and family history.  Reviewed problem list, medications and allergies. Physical Assessment:   Vitals:   12/24/23 1626  BP: 117/67  Pulse: 84  Weight: 182 lb (82.6 kg)  Body mass index is 28.51 kg/m.        Physical Examination:   General appearance: Well appearing, and in no distress  Mental status: Alert, oriented to person, place, and time  Skin: Warm & dry  Cardiovascular: Normal heart rate noted  Respiratory: Normal respiratory effort, no distress  Abdomen: Soft, gravid, nontender  Pelvic: Cervical exam performed  Dilation: 4.5 Effacement (%): 50 Station: -2  Extremities: Edema: None  Fetal Status: Fetal Heart Rate (bpm): 140s   Movement: Present Presentation: Vertex  No results found for this or any previous visit (from the past 24 hours).  Assessment & Plan:  1) Low-risk pregnancy G2P1001 at [redacted]w[redacted]d with an Estimated Date of Delivery: 12/25/23   2) PD management, membranes swept per pt request; she has been doing the [employer] regularly with only cramping resulting; if no labor, plan for NST on 8/25 ([redacted]w[redacted]d)  with PDIOL scheduled for 41.0wks   Meds: No orders of the defined types were placed in this encounter.  Labs/procedures today: SVE & membrane sweep  Plan:  Continue routine obstetrical care   Reviewed: Term labor symptoms and general obstetric precautions including but not limited to vaginal bleeding, contractions, leaking of fluid and fetal movement were reviewed in detail with the patient.  All questions were answered. Didn't ask about home bp cuff.  Check bp weekly, let us  know if >140/90.   Follow-up: Return for NST Monday.  No orders of the defined types were placed in this encounter.  Suzen JONETTA Gentry CNM 12/24/2023 11:21 PM

## 2023-12-25 ENCOUNTER — Encounter: Payer: Self-pay | Admitting: Advanced Practice Midwife

## 2023-12-25 ENCOUNTER — Other Ambulatory Visit: Payer: Self-pay | Admitting: Advanced Practice Midwife

## 2023-12-25 DIAGNOSIS — O480 Post-term pregnancy: Secondary | ICD-10-CM

## 2023-12-25 NOTE — Progress Notes (Incomplete)
   LOW-RISK PREGNANCY VISIT Patient name: Zoe Carroll MRN [id number]  Date of birth: [birth year] Chief Complaint:   Routine Prenatal Visit (Cervical check)  History of Present Illness:   Zoe Carroll is a 18 y.o. G50P1001 female at [redacted]w[redacted]d with an Estimated Date of Delivery: 12/25/23 being seen today for ongoing management of a low-risk pregnancy.  Today she reports being ready for labor, but doesn't want induction if possible. Contractions: Irregular.  .  Movement: Present. denies leaking of fluid. Review of Systems:   Pertinent items are noted in HPI Denies abnormal vaginal discharge w/ itching/odor/irritation, headaches, visual changes, shortness of breath, chest pain, abdominal pain, severe nausea/vomiting, or problems with urination or bowel movements unless otherwise stated above. Pertinent History Reviewed:  Reviewed past medical,surgical, social, obstetrical and family history.  Reviewed problem list, medications and allergies. Physical Assessment:   Vitals:   12/24/23 1626  BP: 117/67  Pulse: 84  Weight: 182 lb (82.6 kg)  Body mass index is 28.51 kg/m.        Physical Examination:   General appearance: Well appearing, and in no distress  Mental status: Alert, oriented to person, place, and time  Skin: Warm & dry  Cardiovascular: Normal heart rate noted  Respiratory: Normal respiratory effort, no distress  Abdomen: Soft, gravid, nontender  Pelvic: Cervical exam performed  Dilation: 4.5 Effacement (%): 50 Station: -2  Extremities: Edema: None  Fetal Status: Fetal Heart Rate (bpm): 140s   Movement: Present Presentation: Vertex  No results found for this or any previous visit (from the past 24 hours).  Assessment & Plan:  1) Low-risk pregnancy G2P1001 at [redacted]w[redacted]d with an Estimated Date of Delivery: 12/25/23   2) PD management, membranes swept per pt request; she has been doing the [employer] regularly with only cramping resulting; if no labor, plan for NST on 8/25 ([redacted]w[redacted]d);  IOL schedule full 8/26-9/1, so written in the elective book on 8/28 with PD comment, and actually scheduled 9/2    Meds: No orders of the defined types were placed in this encounter.  Labs/procedures today: SVE & membrane sweep  Plan:  Continue routine obstetrical care   Reviewed: Term labor symptoms and general obstetric precautions including but not limited to vaginal bleeding, contractions, leaking of fluid and fetal movement were reviewed in detail with the patient.  All questions were answered. Didn't ask about home bp cuff.  Check bp weekly, let us  know if >140/90.   Follow-up: Return for NST Monday.  No orders of the defined types were placed in this encounter.  Zoe Carroll CNM 12/24/2023 11:21 PM

## 2023-12-29 ENCOUNTER — Other Ambulatory Visit

## 2023-12-29 ENCOUNTER — Encounter: Payer: Self-pay | Admitting: Women's Health

## 2023-12-29 ENCOUNTER — Ambulatory Visit (INDEPENDENT_AMBULATORY_CARE_PROVIDER_SITE_OTHER): Admitting: Women's Health

## 2023-12-29 VITALS — BP 127/79 | HR 101 | Wt 182.0 lb

## 2023-12-29 DIAGNOSIS — Z3A40 40 weeks gestation of pregnancy: Secondary | ICD-10-CM

## 2023-12-29 DIAGNOSIS — Z3483 Encounter for supervision of other normal pregnancy, third trimester: Secondary | ICD-10-CM

## 2023-12-29 DIAGNOSIS — O480 Post-term pregnancy: Secondary | ICD-10-CM

## 2023-12-29 NOTE — Patient Instructions (Signed)
 Zoe Carroll, thank you for choosing our office today! We appreciate the opportunity to meet your healthcare needs. You may receive a short survey by mail, e-mail, or through [employer]. If you are happy with your care we would appreciate if you could take just a few minutes to complete the survey questions. We read all of your comments and take your feedback very seriously. Thank you again for choosing our office.  Center for [employer] Team at [facility]  [facility] at [facility] ([address] [place], [place] [address]) Entrance C, located off of [address] Free 24/7 valet parking   CLASSES: Go to [email or web address] to register for classes (childbirth, breastfeeding, waterbirth, infant CPR, daddy bootcamp, etc.)  Call the office [phone]) or go to [facility] if: You begin to have strong, frequent contractions Your water breaks.  Sometimes it is a big gush of fluid, sometimes it is just a trickle that keeps getting your panties wet or running down your legs You have vaginal bleeding.  It is normal to have a small amount of spotting if your cervix was checked.  You don't feel your baby moving like normal.  If you don't, get you something to eat and drink and lay down and focus on feeling your baby move.   If your baby is still not moving like normal, you should call the office or go to [facility].  Call the office [phone]) or go to [facility] hospital for these signs of pre-eclampsia: Severe headache that does not go away with Tylenol  Visual changes- seeing spots, double, blurred vision Pain under your right breast or upper abdomen that does not go away with Tums or heartburn medicine Nausea and/or vomiting Severe swelling in your hands, feet, and face   [place] [facility]/Family Doctors [facility]): [address] [place], [phone]           [facility]: [address], [phone]                 [facility]): [address], [phone] (call to ask if accepting patients) [facility] Department: [address] [place], [phone]    [facility] Pediatricians/Family Doctors [facility]): [address], [phone] [facility]: [address] [place], [phone] [facility]: [address], [phone]  [facility] Doctors  [facility]): [phone] [facility]: [address], [phone]   [facility] Doctors [facility]: [address], [phone]  [facility] Doctors  [facility]: [phone], [phone]  Home Blood Pressure Monitoring for Patients   Your provider has recommended that you check your blood pressure (BP) at least once a week at home. If you do not have a blood pressure cuff at home, one will be provided for you. Contact your provider if you have not received your monitor within 1 week.   Helpful Tips for Accurate Home Blood Pressure Checks  Don't smoke, exercise, or drink caffeine 30 minutes before checking your BP Use the restroom before checking your BP (a full bladder can raise your pressure) Relax in a comfortable upright chair Feet on the ground Left arm resting comfortably on a flat surface at the level of your heart Legs uncrossed Back supported Sit quietly and don't talk Place the cuff on your bare arm Adjust snuggly, so that only two fingertips  can fit between your skin and the top of the cuff Check 2 readings separated by at least one minute Keep a log of your BP readings For a visual, please reference this diagram: http://ccnc.care/bpdiagram  Provider Name: [facility]     Phone: [phone]  Zone 1: ALL CLEAR  Continue to monitor your symptoms:  BP reading is less than 140 (top number) or less than 90 (bottom number)  No right  upper stomach pain No headaches or seeing spots No feeling nauseated or throwing up No swelling in face and hands  Zone 2: CAUTION Call your doctor's office for any of the following:  BP reading is greater than 140 (top number) or greater than 90 (bottom number)  Stomach pain under your ribs in the middle or right side Headaches or seeing spots Feeling nauseated or throwing up Swelling in face and hands  Zone 3: EMERGENCY  Seek immediate medical care if you have any of the following:  BP reading is greater than160 (top number) or greater than 110 (bottom number) Severe headaches not improving with Tylenol  Serious difficulty catching your breath Any worsening symptoms from Zone 2   Braxton Hicks Contractions Contractions of the uterus can occur throughout pregnancy, but they are not always a sign that you are in labor. You may have practice contractions called Braxton Hicks contractions. These false labor contractions are sometimes confused with true labor. What are Darol Irving contractions? Braxton Hicks contractions are tightening movements that occur in the muscles of the uterus before labor. Unlike true labor contractions, these contractions do not result in opening (dilation) and thinning of the cervix. Toward the end of pregnancy (32-34 weeks), Braxton Hicks contractions can happen more often and may become stronger. These contractions are sometimes difficult to tell apart from true labor because they can be very uncomfortable. You should not feel embarrassed if you go to the hospital with false labor. Sometimes, the only way to tell if you are in true labor is for your health care provider to look for changes in the cervix. The health care provider will do a physical exam and may monitor your contractions. If you are not in true labor, the exam should show that your cervix is not dilating and your water has not broken. If there are no other health problems associated with your  pregnancy, it is completely safe for you to be sent home with false labor. You may continue to have Braxton Hicks contractions until you go into true labor. How to tell the difference between true labor and false labor True labor Contractions last 30-70 seconds. Contractions become very regular. Discomfort is usually felt in the top of the uterus, and it spreads to the lower abdomen and low back. Contractions do not go away with walking. Contractions usually become more intense and increase in frequency. The cervix dilates and gets thinner. False labor Contractions are usually shorter and not as strong as true labor contractions. Contractions are usually irregular. Contractions are often felt in the front of the lower abdomen and in the groin. Contractions may go away when you walk around or change positions while lying down. Contractions get weaker and are shorter-lasting as time goes on. The cervix usually does not dilate or become thin. Follow these instructions at home:  Take over-the-counter and prescription medicines only as told by your health care provider. Keep up with your usual exercises and follow other instructions from your health care provider. Eat and drink lightly if you think  you are going into labor. If Braxton Hicks contractions are making you uncomfortable: Change your position from lying down or resting to walking, or change from walking to resting. Sit and rest in a tub of warm water. Drink enough fluid to keep your urine pale yellow. Dehydration may cause these contractions. Do slow and deep breathing several times an hour. Keep all follow-up prenatal visits as told by your health care provider. This is important. Contact a health care provider if: You have a fever. You have continuous pain in your abdomen. Get help right away if: Your contractions become stronger, more regular, and closer together. You have fluid leaking or gushing from your vagina. You pass  blood-tinged mucus (bloody show). You have bleeding from your vagina. You have low back pain that you never had before. You feel your baby's head pushing down and causing pelvic pressure. Your baby is not moving inside you as much as it used to. Summary Contractions that occur before labor are called Braxton Hicks contractions, false labor, or practice contractions. Braxton Hicks contractions are usually shorter, weaker, farther apart, and less regular than true labor contractions. True labor contractions usually become progressively stronger and regular, and they become more frequent. Manage discomfort from [facility] contractions by changing position, resting in a warm bath, drinking plenty of water, or practicing deep breathing. This information is not intended to replace advice given to you by your health care provider. Make sure you discuss any questions you have with your health care provider. Document Revised: 04/04/2017 Document Reviewed: 09/05/2016 Elsevier Patient Education  2020 [employer].

## 2023-12-29 NOTE — Progress Notes (Signed)
 LOW-RISK PREGNANCY VISIT Patient name: Zoe Carroll MRN [id number]  Date of birth: [birth year] Chief Complaint:   Routine Prenatal Visit and Non-stress Test  History of Present Illness:   Zoe Carroll is a 18 y.o. G50P1001 female at [redacted]w[redacted]d with an Estimated Date of Delivery: 12/25/23 being seen today for ongoing management of a low-risk pregnancy.   Today she reports occasional contractions. Contractions: Irritability. Vag. Bleeding: None.  Movement: Present. denies leaking of fluid.     06/11/2023    3:32 PM 11/01/2021    4:23 PM  Depression screen PHQ 2/9  Decreased Interest 0 0  Down, Depressed, Hopeless 0 0  PHQ - 2 Score 0 0  Altered sleeping 0 0  Tired, decreased energy 1 1  Change in appetite 1 0  Feeling bad or failure about yourself  0 0  Trouble concentrating 0 0  Moving slowly or fidgety/restless 0 0  Suicidal thoughts 0 0  PHQ-9 Score 2 1        06/11/2023    3:32 PM 11/01/2021    4:23 PM  GAD 7 : Generalized Anxiety Score  Nervous, Anxious, on Edge 0 0  Control/stop worrying 0 0  Worry too much - different things 0 0  Trouble relaxing 0 0  Restless 0 0  Easily annoyed or irritable 0 0  Afraid - awful might happen 0 0  Total GAD 7 Score 0 0      Review of Systems:   Pertinent items are noted in HPI Denies abnormal vaginal discharge w/ itching/odor/irritation, headaches, visual changes, shortness of breath, chest pain, abdominal pain, severe nausea/vomiting, or problems with urination or bowel movements unless otherwise stated above. Pertinent History Reviewed:  Reviewed past medical,surgical, social, obstetrical and family history.  Reviewed problem list, medications and allergies. Physical Assessment:   Vitals:   12/29/23 1329  BP: 127/79  Pulse: (!) 101  Weight: 182 lb (82.6 kg)  Body mass index is 28.51 kg/m.        Physical Examination:   General appearance: Well appearing, and in no distress  Mental status: Alert, oriented to person, place,  and time  Skin: Warm & dry  Cardiovascular: Normal heart rate noted  Respiratory: Normal respiratory effort, no distress  Abdomen: Soft, gravid, nontender  Pelvic: Cervical exam performed  Dilation: 5 Effacement (%): 80 Station: -2, Offered membrane sweeping, discussed r/b- pt decided to proceed, so membranes swept.   Extremities:    Fetal Status:     Movement: Present Presentation: Vertex NST: FHR baseline 125 bpm, Variability: moderate, Accelerations:present, Decelerations:  Absent= Cat 1/reactive Toco: irregular   Chaperone: Zoe Carroll No results found for this or any previous visit (from the past 24 hours).  Assessment & Plan:  1) Low-risk pregnancy G2P1001 at [redacted]w[redacted]d with an Estimated Date of Delivery: 12/25/23   2) Postdates, reactive NST, membranes swept again, has postdates IOL 8/28 AM if needed   Meds: No orders of the defined types were placed in this encounter.  Labs/procedures today: SVE, membrane sweep, and NST  Reviewed: Term labor symptoms and general obstetric precautions including but not limited to vaginal bleeding, contractions, leaking of fluid and fetal movement were reviewed in detail with the patient.  All questions were answered. Does have home bp cuff. Office bp cuff given: not applicable. Check bp daily, let us  know if consistently >140 and/or >90.  Follow-up: Return for will schedule pp visit after delivery.  Future Appointments  Date Time Provider Department Center  01/01/2024  9:00 AM [facility] SCHED ROOM [facility] None    No orders of the defined types were placed in this encounter.  Zoe Carroll CNM, [facility] 12/29/2023 1:58 PM

## 2023-12-31 ENCOUNTER — Inpatient Hospital Stay (HOSPITAL_COMMUNITY): Admitting: Anesthesiology

## 2023-12-31 ENCOUNTER — Other Ambulatory Visit: Payer: Self-pay

## 2023-12-31 ENCOUNTER — Inpatient Hospital Stay (HOSPITAL_COMMUNITY)
Admission: AD | Admit: 2023-12-31 | Discharge: 2024-01-01 | DRG: 807 | Disposition: A | Discharge status: Home / Self Care | Attending: Family Medicine | Admitting: Family Medicine

## 2023-12-31 ENCOUNTER — Encounter (HOSPITAL_COMMUNITY): Payer: Self-pay | Admitting: Family Medicine

## 2023-12-31 DIAGNOSIS — Z8249 Family history of ischemic heart disease and other diseases of the circulatory system: Secondary | ICD-10-CM | POA: Diagnosis not present

## 2023-12-31 DIAGNOSIS — Z3A40 40 weeks gestation of pregnancy: Secondary | ICD-10-CM

## 2023-12-31 DIAGNOSIS — O480 Post-term pregnancy: Secondary | ICD-10-CM | POA: Diagnosis present

## 2023-12-31 DIAGNOSIS — Z880 Allergy status to penicillin: Secondary | ICD-10-CM | POA: Diagnosis not present

## 2023-12-31 DIAGNOSIS — O26893 Other specified pregnancy related conditions, third trimester: Secondary | ICD-10-CM | POA: Diagnosis present

## 2023-12-31 DIAGNOSIS — Z3480 Encounter for supervision of other normal pregnancy, unspecified trimester: Principal | ICD-10-CM

## 2023-12-31 LAB — CBC
HCT: 36.2 % (ref 36.0–46.0)
Hemoglobin: 11.6 g/dL — ABNORMAL LOW (ref 12.0–15.0)
MCH: 24.6 pg — ABNORMAL LOW (ref 26.0–34.0)
MCHC: 32 g/dL (ref 30.0–36.0)
MCV: 76.9 fL — ABNORMAL LOW (ref 80.0–100.0)
Platelets: 252 K/uL (ref 150–400)
RBC: 4.71 MIL/uL (ref 3.87–5.11)
RDW: 13.4 % (ref 11.5–15.5)
WBC: 14.1 K/uL — ABNORMAL HIGH (ref 4.0–10.5)
nRBC: 0 % (ref 0.0–0.2)

## 2023-12-31 LAB — TYPE AND SCREEN
ABO/RH(D): O POS
Antibody Screen: NEGATIVE

## 2023-12-31 LAB — RPR: RPR Ser Ql: NONREACTIVE

## 2023-12-31 MED ORDER — SOD CITRATE-CITRIC ACID 500-334 MG/5ML PO SOLN: 30.0000 mL | ORAL | Status: DC | PRN

## 2023-12-31 MED ORDER — MEASLES, MUMPS & RUBELLA VAC IJ SOLR: 0.5000 mL | Freq: Once | INTRAMUSCULAR | Status: DC

## 2023-12-31 MED ORDER — ACETAMINOPHEN 325 MG PO TABS: 650.0000 mg | ORAL_TABLET | ORAL | Status: DC | PRN

## 2023-12-31 MED ORDER — DIBUCAINE (PERIANAL) 1 % EX OINT
1.0000 | TOPICAL_OINTMENT | CUTANEOUS | Status: DC | PRN
Start: 2023-12-31 — End: 2024-01-01

## 2023-12-31 MED ORDER — ONDANSETRON HCL 4 MG/2ML IJ SOLN: 4.0000 mg | INTRAMUSCULAR | Status: DC | PRN

## 2023-12-31 MED ORDER — FENTANYL CITRATE (PF) 100 MCG/2ML IJ SOLN: 100.0000 ug | INTRAMUSCULAR | Status: DC | PRN

## 2023-12-31 MED ORDER — TERBUTALINE SULFATE 1 MG/ML IJ SOLN: 0.2500 mg | Freq: Once | INTRAMUSCULAR | Status: DC | PRN

## 2023-12-31 MED ORDER — LIDOCAINE HCL (PF) 1 % IJ SOLN: 30.0000 mL | INTRAMUSCULAR | Status: DC | PRN

## 2023-12-31 MED ORDER — WITCH HAZEL-GLYCERIN EX PADS: 1.0000 | MEDICATED_PAD | CUTANEOUS | Status: DC | PRN

## 2023-12-31 MED ORDER — TETANUS-DIPHTH-ACELL PERTUSSIS 5-2.5-18.5 LF-MCG/0.5 IM SUSY: 0.5000 mL | PREFILLED_SYRINGE | Freq: Once | INTRAMUSCULAR | Status: DC

## 2023-12-31 MED ORDER — BUPIVACAINE HCL (PF) 0.25 % IJ SOLN
INTRAMUSCULAR | Status: DC | PRN
  Administered 2023-12-31: 1.6 mL via INTRATHECAL

## 2023-12-31 MED ORDER — OXYCODONE-ACETAMINOPHEN 5-325 MG PO TABS: 1.0000 | ORAL_TABLET | ORAL | Status: DC | PRN

## 2023-12-31 MED ORDER — SODIUM CHLORIDE 0.9% FLUSH: 3.0000 mL | Freq: Two times a day (BID) | INTRAVENOUS | Status: DC

## 2023-12-31 MED ORDER — LACTATED RINGERS IV SOLN: 500.0000 mL | INTRAVENOUS | Status: DC | PRN

## 2023-12-31 MED ORDER — PHENYLEPHRINE 80 MCG/ML (10ML) SYRINGE FOR IV PUSH (FOR BLOOD PRESSURE SUPPORT): 80.0000 ug | PREFILLED_SYRINGE | INTRAVENOUS | Status: DC | PRN

## 2023-12-31 MED ORDER — SIMETHICONE 80 MG PO CHEW: 80.0000 mg | CHEWABLE_TABLET | ORAL | Status: DC | PRN

## 2023-12-31 MED ORDER — LACTATED RINGERS IV SOLN
500.0000 mL | Freq: Once | INTRAVENOUS | Status: AC
  Administered 2023-12-31: 500 mL via INTRAVENOUS

## 2023-12-31 MED ORDER — ONDANSETRON HCL 4 MG PO TABS: 4.0000 mg | ORAL_TABLET | ORAL | Status: DC | PRN

## 2023-12-31 MED ORDER — OXYTOCIN BOLUS FROM INFUSION
333.0000 mL | Freq: Once | INTRAVENOUS | Status: AC
  Administered 2023-12-31: 333 mL via INTRAVENOUS

## 2023-12-31 MED ORDER — FENTANYL-BUPIVACAINE-NACL 0.5-0.125-0.9 MG/250ML-% EP SOLN
12.0000 mL/h | EPIDURAL | Status: DC | PRN
  Administered 2023-12-31: 12 mL/h via EPIDURAL
  Filled 2023-12-31: qty 250

## 2023-12-31 MED ORDER — IBUPROFEN 800 MG PO TABS
800.0000 mg | ORAL_TABLET | Freq: Three times a day (TID) | ORAL | Status: DC
  Administered 2023-12-31 – 2024-01-01 (×3): 800 mg via ORAL
  Filled 2023-12-31 (×3): qty 1

## 2023-12-31 MED ORDER — OXYTOCIN-SODIUM CHLORIDE 30-0.9 UT/500ML-% IV SOLN: 1.0000 m[IU]/min | INTRAVENOUS | Status: DC

## 2023-12-31 MED ORDER — SODIUM CHLORIDE 0.9 % IV SOLN: 250.0000 mL | INTRAVENOUS | Status: DC | PRN

## 2023-12-31 MED ORDER — LACTATED RINGERS IV SOLN: INTRAVENOUS | Status: DC

## 2023-12-31 MED ORDER — EPHEDRINE 5 MG/ML INJ: 10.0000 mg | INTRAVENOUS | Status: DC | PRN

## 2023-12-31 MED ORDER — OXYCODONE-ACETAMINOPHEN 5-325 MG PO TABS: 2.0000 | ORAL_TABLET | ORAL | Status: DC | PRN

## 2023-12-31 MED ORDER — BENZOCAINE-MENTHOL 20-0.5 % EX AERO
1.0000 | INHALATION_SPRAY | CUTANEOUS | Status: DC | PRN
  Administered 2023-12-31: 1 via TOPICAL
  Filled 2023-12-31: qty 56

## 2023-12-31 MED ORDER — SENNOSIDES-DOCUSATE SODIUM 8.6-50 MG PO TABS
2.0000 | ORAL_TABLET | ORAL | Status: DC
  Administered 2023-12-31: 2 via ORAL
  Filled 2023-12-31: qty 2

## 2023-12-31 MED ORDER — OXYTOCIN-SODIUM CHLORIDE 30-0.9 UT/500ML-% IV SOLN
2.5000 [IU]/h | INTRAVENOUS | Status: DC
  Filled 2023-12-31: qty 500

## 2023-12-31 MED ORDER — DIPHENHYDRAMINE HCL 50 MG/ML IJ SOLN: 12.5000 mg | INTRAMUSCULAR | Status: DC | PRN

## 2023-12-31 MED ORDER — PRENATAL MULTIVITAMIN CH
1.0000 | ORAL_TABLET | Freq: Every day | ORAL | Status: DC
Start: 2024-01-01 — End: 2024-01-01
  Administered 2024-01-01: 1 via ORAL
  Filled 2023-12-31: qty 1

## 2023-12-31 MED ORDER — ONDANSETRON HCL 4 MG/2ML IJ SOLN: 4.0000 mg | Freq: Four times a day (QID) | INTRAMUSCULAR | Status: DC | PRN

## 2023-12-31 MED ORDER — DIPHENHYDRAMINE HCL 25 MG PO CAPS: 25.0000 mg | ORAL_CAPSULE | Freq: Four times a day (QID) | ORAL | Status: DC | PRN

## 2023-12-31 MED ORDER — COCONUT OIL OIL: 1.0000 | TOPICAL_OIL | Status: DC | PRN

## 2023-12-31 MED ORDER — SODIUM CHLORIDE 0.9% FLUSH: 3.0000 mL | INTRAVENOUS | Status: DC | PRN

## 2023-12-31 NOTE — Anesthesia Procedure Notes (Signed)
 Epidural Patient location during procedure: OB Start time: 12/31/2023 8:30 AM End time: 12/31/2023 8:40 AM  Staffing Anesthesiologist: Niels Marien CROME, MD Performed: anesthesiologist   Preanesthetic Checklist Completed: patient identified, IV checked, risks and benefits discussed, monitors and equipment checked, pre-op evaluation and timeout performed  Epidural Patient position: sitting Prep: DuraPrep and site prepped and draped Patient monitoring: continuous pulse ox, blood pressure, heart rate and cardiac monitor Approach: midline Location: L3-L4 Injection technique: LOR air  Needle:  Needle type: Tuohy  Needle gauge: 17 G Needle length: 9 cm Needle insertion depth: 5 cm Catheter type: closed end flexible Catheter size: 19 Gauge Catheter at skin depth: 10 cm Test dose: negative  Assessment Sensory level: T8 Events: blood not aspirated, no cerebrospinal fluid, injection not painful, no injection resistance, no paresthesia and negative IV test  Additional Notes Patient identified. Risks/Benefits/Options discussed with patient including but not limited to bleeding, infection, nerve damage, paralysis, failed block, incomplete pain control, headache, blood pressure changes, nausea, vomiting, reactions to medication both or allergic, itching and postpartum back pain. Confirmed with bedside nurse the patient's most recent platelet count. Confirmed with patient that they are not currently taking any anticoagulation, have any bleeding history or any family history of bleeding disorders. Patient expressed understanding and wished to proceed. All questions were answered. Sterile technique was used throughout the entire procedure. Please see nursing notes for vital signs. Test dose was given through epidural catheter and negative prior to continuing to dose epidural or start infusion. Warning signs of high block given to the patient including shortness of breath, tingling/numbness in hands,  complete motor block, or any concerning symptoms with instructions to call for help. Patient was given instructions on fall risk and not to get out of bed. All questions and concerns addressed with instructions to call with any issues or inadequate analgesia.    CSE performed 2/2 advanced cervical exam. LOR at 5cm. 25g pencan inserted into intrathecal space with return of clear CSF. Marcaine  administered intrathecally as per chart. Epidural catheter inserted into epidural space and left at 10cm at skin. Pt tolerated well. No complications. VSS. Reason for block:procedure for pain

## 2023-12-31 NOTE — Lactation Note (Signed)
 This note was copied from a baby's chart. Lactation Consultation Note  Patient Name: Zoe Carroll Date: 12/31/2023 Age:20 hours Reason for consult: Initial assessment;Term;Breastfeeding assistance  P2- MOB's feeding plan is to offer both breast milk and formula. MOB reports that infant is latching well, but it takes him a few tries to latch due to being gaggy. LC reviewed how it is common for infant's to have remaining amniotic fluid in their belly for a few days after delivery. LC encouraged MOB to keep feeding him every 3 hrs, even if he is not showing interest. Infant was alert and rooting, so LC changed his dirty diaper and placed him in MOB's arms. MOB placed infant on the left breast in the cross cradle hold. After a few attempts of him only licking the breast, infant was able to latch on with flanged lips and a rhythmic suck. MOB denied having any pain or discomfort. MOB denied having any concerns at this time.  LC reviewed the first 24 hr birthday nap, day 2 cluster feeding, feeding infant on cue 8-12x in 24 hrs, not allowing infant to go over 3 hrs without a feeding, CDC milk storage guidelines, LC services handout and engorgement/breast care. LC encouraged MOB to call for further assistance as needed. MOB does not have a pump and does not qualify for a STORK. LC reviewed how to order a pump through her insurance.  Maternal Data Has patient been taught Hand Expression?: No Does the patient have breastfeeding experience prior to this delivery?: Yes How long did the patient breastfeed?: 2 years  Feeding Mother's Current Feeding Choice: Breast Milk and Formula  LATCH Score Latch: Repeated attempts needed to sustain latch, nipple held in mouth throughout feeding, stimulation needed to elicit sucking reflex.  Audible Swallowing: A few with stimulation  Type of Nipple: Everted at rest and after stimulation  Comfort (Breast/Nipple): Soft / non-tender  Hold (Positioning):  Assistance needed to correctly position infant at breast and maintain latch.  LATCH Score: 7   Lactation Tools Discussed/Used Pump Education: Milk Storage  Interventions Interventions: Breast feeding basics reviewed;Assisted with latch;Breast compression;Adjust position;Support pillows;Position options;Education;LC Services brochure  Discharge Discharge Education: Engorgement and breast care;Warning signs for feeding baby Pump: Advised to call insurance company  Consult Status Consult Status: Follow-up Date: 01/01/24 Follow-up type: In-patient    Recardo Hoit BS, IBCLC 12/31/2023, 9:41 PM

## 2023-12-31 NOTE — Anesthesia Preprocedure Evaluation (Signed)
Anesthesia Evaluation  Patient identified by MRN, date of birth, ID band Patient awake    Reviewed: Allergy & Precautions, NPO status , Patient's Chart, lab work & pertinent test results  Airway Mallampati: II  TM Distance: >3 FB Neck ROM: Full    Dental no notable dental hx.    Pulmonary neg pulmonary ROS   Pulmonary exam normal breath sounds clear to auscultation       Cardiovascular negative cardio ROS Normal cardiovascular exam Rhythm:Regular Rate:Normal     Neuro/Psych negative neurological ROS  negative psych ROS   GI/Hepatic negative GI ROS, Neg liver ROS,,,  Endo/Other  negative endocrine ROS    Renal/GU negative Renal ROS  negative genitourinary   Musculoskeletal negative musculoskeletal ROS (+)    Abdominal   Peds  Hematology negative hematology ROS (+)   Anesthesia Other Findings Presents in labor  Reproductive/Obstetrics (+) Pregnancy                              Anesthesia Physical Anesthesia Plan  ASA: 2  Anesthesia Plan: Epidural   Post-op Pain Management:    Induction:   PONV Risk Score and Plan: Treatment may vary due to age or medical condition  Airway Management Planned: Natural Airway  Additional Equipment:   Intra-op Plan:   Post-operative Plan:   Informed Consent: I have reviewed the patients History and Physical, chart, labs and discussed the procedure including the risks, benefits and alternatives for the proposed anesthesia with the patient or authorized representative who has indicated his/her understanding and acceptance.       Plan Discussed with: Anesthesiologist  Anesthesia Plan Comments: (Patient identified. Risks, benefits, options discussed with patient including but not limited to bleeding, infection, nerve damage, paralysis, failed block, incomplete pain control, headache, blood pressure changes, nausea, vomiting, reactions to  medication, itching, and post partum back pain. Confirmed with bedside nurse the patient's most recent platelet count. Confirmed with the patient that they are not taking any anticoagulation, have any bleeding history or any family history of bleeding disorders. Patient expressed understanding and wishes to proceed. All questions were answered. )         Anesthesia Quick Evaluation  

## 2023-12-31 NOTE — Discharge Summary (Signed)
 Postpartum Discharge Summary     Patient Name: Zoe Carroll DOB: 09/14/2005 MRN: [id number]  Date of admission: 12/31/2023 Delivery date:12/31/2023 Delivering provider: Kaelob Persky L Date of discharge: 01/01/2024  Admitting diagnosis: Post term pregnancy over 40 weeks [O48.0] Intrauterine pregnancy: [redacted]w[redacted]d     Secondary diagnosis:  Principal Problem:   Post term pregnancy over 40 weeks  Additional problems: N/A    Discharge diagnosis: Term Pregnancy Delivered                                              Post partum procedures:none Augmentation: None Complications: None  Hospital course: Onset of Labor With Vaginal Delivery      18 y.o. yo G2P1001 at [redacted]w[redacted]d was admitted in Active Labor on 12/31/2023. Labor course was uncomplicated Membrane Rupture Time/Date: 12:30 PM,12/31/2023  Delivery Method:Vaginal, Spontaneous Operative Delivery:N/A Episiotomy: None Lacerations:  None;Periurethral Patient had a postpartum course complicated by nothing.  She is ambulating, tolerating a regular diet, passing flatus, and urinating well. Patient is discharged home in stable condition on 01/01/24.  Newborn Data: Birth date:12/31/2023 Birth time:12:53 PM Gender:Female Living status:Living Apgars:8 ,9  [id number]  Magnesium Sulfate received: No BMZ received: No Rhophylac:N/A MMR:N/A Immune T-DaP:Given prenatally Flu: No RSV Vaccine received: No Transfusion:No  Immunizations received: Immunization History  Administered Date(s) Administered   Tdap 10/28/2023    Physical exam  Vitals:   12/31/23 1650 12/31/23 2053 12/31/23 2330 01/01/24 0559  BP: 114/62 (!) 116/57 120/72 108/66  Pulse: 72 80 75 88  Resp: 18 18 18 18   Temp: 98.3 F (36.8 C) 98.2 F (36.8 C) 98 F (36.7 C) 98.2 F (36.8 C)  TempSrc: Oral Oral Oral Oral  SpO2:  98% 99% 100%  Weight:      Height:       General: alert, cooperative, and no distress Lochia: appropriate Uterine Fundus:  firm Incision: N/A DVT Evaluation: No evidence of DVT seen on physical exam. No significant calf/ankle edema. Labs: Lab Results  Component Value Date   WBC 14.1 (H) 12/31/2023   HGB 11.6 (L) 12/31/2023   HCT 36.2 12/31/2023   MCV 76.9 (L) 12/31/2023   PLT 252 12/31/2023      Latest Ref Rng & Units 10/15/2021    4:41 PM  CMP  Glucose 70 - 99 mg/dL 88   BUN 6 - 20 mg/dL 7   Creatinine 9.55 - 8.99 mg/dL 9.65   Sodium 864 - 854 mmol/L 135   Potassium 3.5 - 5.1 mmol/L 3.9   Chloride 98 - 111 mmol/L 106   CO2 22 - 32 mmol/L 24   Calcium 8.9 - 10.3 mg/dL 8.7   Total Protein 6.5 - 8.1 g/dL 6.6   Total Bilirubin 0.3 - 1.2 mg/dL 0.5   Alkaline Phos 38 - 126 U/L 146   AST 15 - 41 U/L 14   ALT 0 - 44 U/L 12    Edinburgh Score:    01/01/2024    7:47 AM  Edinburgh Postnatal Depression Scale Screening Tool  I have been able to laugh and see the funny side of things. 0  I have looked forward with enjoyment to things. 0  I have blamed myself unnecessarily when things went wrong. 0  I have been anxious or worried for no good reason. 0  I have felt scared or panicky for no  good reason. 0  Things have been getting on top of me. 0  I have been so unhappy that I have had difficulty sleeping. 0  I have felt sad or miserable. 0  I have been so unhappy that I have been crying. 0  The thought of harming myself has occurred to me. 0  Edinburgh Postnatal Depression Scale Total 0   Edinburgh Postnatal Depression Scale Total: 0   After visit meds:  Allergies as of 01/01/2024       Reactions   Amoxicillin Anaphylaxis   Penicillin G Anaphylaxis        Medication List     STOP taking these medications    Blood Pressure Monitor Misc   ferrous sulfate  325 (65 FE) MG tablet   hydrOXYzine  25 MG tablet Commonly known as: ATARAX    magnesium 84 MG ( ) Tbcr SR tablet Commonly known as: MAGTAB   metroNIDAZOLE  500 MG tablet Commonly known as: FLAGYL    terconazole  0.4 % vaginal  cream Commonly known as: TERAZOL 7        TAKE these medications    ibuprofen  800 MG tablet Commonly known as: ADVIL  Take 1 tablet (800 mg total) by mouth every 8 (eight) hours.   prenatal vitamin w/FE, FA 27-1 MG Tabs tablet Take 1 tablet by mouth daily at 12 noon.   senna-docusate 8.6-50 MG tablet Commonly known as: Senokot-S Take 2 tablets by mouth daily.         Discharge home in stable condition Infant Feeding: Bottle Infant Disposition:home with mother Discharge instruction: per After Visit Summary and Postpartum booklet. Activity: Advance as tolerated. Pelvic rest for 6 weeks.  Diet: routine diet Future Appointments: Future Appointments  Date Time Provider Department Center  02/10/2024 10:30 AM Kizzie Suzen SAUNDERS, CNM [facility]   Follow up Visit:   Please schedule this patient for a In person postpartum visit in 6 weeks with the following provider: Any provider. Additional Postpartum F/U:N/A  Low risk pregnancy complicated by: None Delivery mode:  Vaginal, Spontaneous Anticipated Birth Control:  partner vasectomy   01/01/2024 Barkley LITTIE Angles, MD

## 2023-12-31 NOTE — MAU Note (Signed)
 Monitors d/c'd for transport to L&D

## 2023-12-31 NOTE — H&P (Signed)
 OBSTETRIC ADMISSION HISTORY AND PHYSICAL  Zoe Carroll is a 18 y.o. female G2P1001 with IUP at [redacted]w[redacted]d (dated by LMP, Estimated Date of Delivery: 12/25/23) presenting for SOL.   She reports +FMs, No LOF, no VB, no blurry vision, headaches or peripheral edema, and RUQ pain.    She plans on breast feeding. She request partner vasectomy for birth control.  She received her [facility]   Prenatal History/Complications: None  Past Medical History: Past Medical History:  Diagnosis Date   Medical history non-contributory     Past Surgical History: Past Surgical History:  Procedure Laterality Date   NO PAST SURGERIES      Obstetrical History: OB History     Gravida  2   Para  1   Term  1   Preterm      AB      Living  1      SAB      IAB      Ectopic      Multiple  0   Live Births  1           Social History Social History   Socioeconomic History   Marital status: Single    Spouse name: Not on file   Number of children: Not on file   Years of education: Not on file   Highest education level: Not on file  Occupational History   Not on file  Tobacco Use   Smoking status: Never   Smokeless tobacco: Never  Vaping Use   Vaping status: Never Used  Substance and Sexual Activity   Alcohol use: Never   Drug use: Never   Sexual activity: Yes    Birth control/protection: None  Other Topics Concern   Not on file  Social History Narrative   Not on file   Social Drivers of Health   Financial Resource Strain: Low Risk  (06/12/2023)   Overall Financial Resource Strain (CARDIA)    Difficulty of Paying Living Expenses: Not hard at all  Food Insecurity: No Food Insecurity (12/31/2023)   Hunger Vital Sign    Worried About Running Out of Food in the Last Year: Never true    Ran Out of Food in the Last Year: Never true  Transportation Needs: No Transportation Needs (12/31/2023)   PRAPARE - Administrator, Civil Service (Medical):  No    Lack of Transportation (Non-Medical): No  Physical Activity: Insufficiently Active (06/12/2023)   Exercise Vital Sign    Days of Exercise per Week: 4 days    Minutes of Exercise per Session: 30 min  Stress: No Stress Concern Present (06/12/2023)   [employer] of Occupational Health - Occupational Stress Questionnaire    Feeling of Stress : Not at all  Social Connections: Unknown (12/31/2023)   Social Connection and Isolation Panel    Frequency of Communication with Friends and Family: More than three times a week    Frequency of Social Gatherings with Friends and Family: Three times a week    Attends Religious Services: Patient declined    Active Member of Clubs or Organizations: Patient declined    Attends Banker Meetings: Patient declined    Marital Status: Living with partner    Family History: Family History  Problem Relation Age of Onset   Lupus Mother    Colitis Mother    Muscular dystrophy Mother    COPD Father    Stroke Maternal Grandmother  Lung cancer Paternal Grandmother    Skin cancer Paternal Grandmother    Heart attack Paternal Grandfather     Allergies: Allergies  Allergen Reactions   Amoxicillin Anaphylaxis   Penicillin G Anaphylaxis    Medications Prior to Admission  Medication Sig Dispense Refill Last Dose/Taking   prenatal vitamin w/FE, FA (PRENATAL 1 + 1) 27-1 MG TABS tablet Take 1 tablet by mouth daily at 12 noon. 30 tablet 12 12/30/2023   Blood Pressure Monitor MISC For regular home bp monitoring during pregnancy 1 each 0    ferrous sulfate  325 (65 FE) MG tablet Take 1 tablet (325 mg total) by mouth every other day. (Patient not taking: Reported on 12/29/2023) 45 tablet 2    hydrOXYzine  (ATARAX ) 25 MG tablet Take 1 tablet (25 mg total) by mouth every 6 (six) hours. (Patient not taking: Reported on 12/29/2023) 30 tablet 2    magnesium (MAGTAB) 84 MG ( ) TBCR SR tablet Take 84 mg by mouth.   Unknown   metroNIDAZOLE  (FLAGYL ) 500  MG tablet Take 1 tablet (500 mg total) by mouth 2 (two) times daily. (Patient not taking: Reported on 12/29/2023) 14 tablet 0    terconazole  (TERAZOL 7 ) 0.4 % vaginal cream Place 1 applicator vaginally at bedtime. (Patient not taking: Reported on 12/29/2023) 45 g 0      Review of Systems  All systems reviewed and negative except as stated in HPI.  Blood pressure 118/66, pulse (!) 108, temperature 98.2 F (36.8 C), temperature source Oral, resp. rate 18, height 5' 7 (1.702 m), weight 83.2 kg, last menstrual period 03/20/2023, SpO2 99%, currently breastfeeding. General appearance: alert, cooperative, and appears stated age Lungs: breathing comfortably on room air Heart: regular rate Abdomen: soft, non-tender; gravid, Extremities: no edema of bilateral lower extremities DTR's intact Presentation: cephalic Fetal monitoringBaseline: 145 bpm, Variability: Good {> 6 bpm), Accelerations: Reactive, and Decelerations: Absent Uterine activityFrequency: Every 3-5 minutes Dilation: 10 Effacement (%): 100 Station: Plus 1 Exam by:: Mitzie Sar RN   Prenatal labs: ABO, Rh: --/--/O POS (08/27 9188) Antibody: NEG (08/27 9188) Rubella: 3.68 (02/05 1605) RPR: NON REACTIVE (08/27 0812)  HBsAg: Negative (02/05 1605)  HIV: Non Reactive (05/27 0930)  GBS: --Hennie (07/24 1330)  2 hr Glucola normal Genetic screening  declined Anatomy US  normal Last US : At [redacted]w[redacted]d - cephalic presentation, EFW 3224g (46 %tile), AC 76%ile  Prenatal Transfer Tool  Maternal Diabetes: No Genetic Screening: Normal Maternal Ultrasounds/Referrals: Normal Fetal Ultrasounds or other Referrals:  None Maternal Substance Abuse:  No Significant Maternal Medications:  None Significant Maternal Lab Results:  Group B Strep negative Number of Prenatal Visits:greater than 3 verified prenatal visits Other Comments:  None  Results for orders placed or performed during the hospital encounter of 12/31/23 (from the past 24 hours)   Type and screen   Collection Time: 12/31/23  8:11 AM  Result Value Ref Range   ABO/RH(D) O POS    Antibody Screen NEG    Sample Expiration      01/03/2024,2359 Performed at [facility], [address], [place], [place] [address]   CBC   Collection Time: 12/31/23  8:12 AM  Result Value Ref Range   WBC 14.1 (H) 4.0 - 10.5 K/uL   RBC 4.71 3.87 - 5.11 MIL/uL   Hemoglobin 11.6 (L) 12.0 - 15.0 g/dL   HCT 63.7 63.9 - 53.9 %   MCV 76.9 (L) 80.0 - 100.0 fL   MCH 24.6 (L) 26.0 - 34.0 pg  MCHC 32.0 30.0 - 36.0 g/dL   RDW 86.5 88.4 - 84.4 %   Platelets 252 150 - 400 K/uL   nRBC 0.0 0.0 - 0.2 %  RPR   Collection Time: 12/31/23  8:12 AM  Result Value Ref Range   RPR Ser Ql NON REACTIVE NON REACTIVE    Patient Active Problem List   Diagnosis Date Noted   Post term pregnancy over 40 weeks 12/31/2023   Encounter for supervision of normal pregnancy, antepartum 06/06/2023   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 G2P1001 at [redacted]w[redacted]d here for SOL  #Labor:Expectant management #Pain: Requesting epidural #FWB: Cat I #ID:  GBS negative #MOF: Breast #MOC:partner vasectomy #Circ:  yes  Barkley Angles, MD OB Fellow, Faculty Practice [place], Center for [facility] 12/31/2023 1:20 PM

## 2023-12-31 NOTE — MAU Note (Signed)
 Zoe Carroll is a 18 y.o. at [redacted]w[redacted]d here in MAU reporting: regular ctxs since 0300. Now every 3 min. Spotting since membranes swept on Mon, was 5 cm. No leaking. No problems. Reports +FM  Onset of complaint: 0300  Vitals:   12/31/23 0741  BP: (!) 140/65  Pulse: 92  Resp: 18  Temp: 98.4 F (36.9 C)  SpO2: 100%     FHT:145 Lab orders placed from triage:

## 2024-01-01 ENCOUNTER — Inpatient Hospital Stay (HOSPITAL_COMMUNITY)

## 2024-01-01 ENCOUNTER — Inpatient Hospital Stay (HOSPITAL_COMMUNITY): Admission: AD | Admit: 2024-01-01 | Source: Home / Self Care | Admitting: Family Medicine

## 2024-01-01 MED ORDER — SENNOSIDES-DOCUSATE SODIUM 8.6-50 MG PO TABS: 2.0000 | ORAL_TABLET | ORAL | 0 refills | Status: AC

## 2024-01-01 MED ORDER — IBUPROFEN 800 MG PO TABS: 800.0000 mg | ORAL_TABLET | Freq: Three times a day (TID) | ORAL | 0 refills | Status: AC

## 2024-01-01 NOTE — Lactation Note (Signed)
 This note was copied from a baby's chart. Lactation Consultation Note  Patient Name: Boy Magali Bray Unijb'd Date: 01/01/2024 Age:20 hours Reason for consult: Follow-up assessment;Term  P2, Baby is primarily formula feeding and occasionally latching at the breast.  Discussed supply and demand. Reviewed engorgement care and monitoring voids/stools. Suggest calling for help as needed.  Mother states she recently attempted latching but baby has been sleepy after circumcision.  Reviewed cluster feeding.   Maternal Data Has patient been taught Hand Expression?: Yes  Feeding Mother's Current Feeding Choice: Breast Milk and Formula Nipple Type: Slow - flow  Interventions Interventions: Education  Discharge Discharge Education: Engorgement and breast care;Warning signs for feeding baby  Consult Status Consult Status: Complete Date: 12/31/23   Shannon Levorn Lemme  RN, IBCLC 01/01/2024, 10:58 AM

## 2024-01-01 NOTE — Patient Instructions (Signed)
 If interested in an outpatient lactation consult in office or virtually please reach out to us  at [facility] (First Floor) [address], [place] Please call [phone] and press [phone] for lactation.   -- Lactation support groups:  [facility], Tuesdays 10:00 am -12:00 pm at [address] on the second floor in the conference room, lactating parents and lap babies welcome.  [email or web address]  [email or web address]    Zoe Carroll, [facility] for [facility]

## 2024-01-01 NOTE — Anesthesia Postprocedure Evaluation (Signed)
 Anesthesia Post Note  Patient: Yessica D Davlin  Procedure(s) Performed: AN AD HOC LABOR EPIDURAL     Patient location during evaluation: Mother Baby Anesthesia Type: Epidural Level of consciousness: awake and alert Pain management: pain level controlled Vital Signs Assessment: post-procedure vital signs reviewed and stable Respiratory status: spontaneous breathing, nonlabored ventilation and respiratory function stable Cardiovascular status: stable Postop Assessment: no headache, no backache and epidural receding Anesthetic complications: no   No notable events documented.  Last Vitals:  Vitals:   12/31/23 2330 01/01/24 0559  BP: 120/72 108/66  Pulse: 75 88  Resp: 18 18  Temp: 36.7 C 36.8 C  SpO2: 99% 100%    Last Pain:  Vitals:   01/01/24 0747  TempSrc:   PainSc: 1    Pain Goal:                Epidural/Spinal Function Cutaneous sensation: Normal sensation (01/01/24 0747), Patient able to flex knees: Yes (01/01/24 0747), Patient able to lift hips off bed: Yes (01/01/24 0747), Back pain beyond tenderness at insertion site: No (01/01/24 0747), Progressively worsening motor and/or sensory loss: No (01/01/24 0747)  Joselle Deeds

## 2024-01-06 ENCOUNTER — Inpatient Hospital Stay (HOSPITAL_COMMUNITY)

## 2024-01-12 ENCOUNTER — Telehealth (HOSPITAL_COMMUNITY): Payer: Self-pay | Admitting: *Deleted

## 2024-01-12 NOTE — Telephone Encounter (Signed)
 01/12/2024  Name: Zoe Carroll MRN: [id number] DOB: 2006/04/09  Reason for Call:  Transition of Care Hospital Discharge Call  Contact Status: Patient Contact Status: Complete  Language assistant needed:          Follow-Up Questions: Do You Have Any Concerns About Your Health As You Heal From Delivery?: No Do You Have Any Concerns About Your Infants Health?: No  Edinburgh Postnatal Depression Scale:  In the Past 7 Days: I have been able to laugh and see the funny side of things.: As much as I always could I have looked forward with enjoyment to things.: As much as I ever did I have blamed myself unnecessarily when things went wrong.: No, never I have been anxious or worried for no good reason.: No, not at all I have felt scared or panicky for no good reason.: No, not at all Things have been getting on top of me.: No, most of the time I have coped quite well I have been so unhappy that I have had difficulty sleeping.: Not at all I have felt sad or miserable.: No, not at all I have been so unhappy that I have been crying.: No, never The thought of harming myself has occurred to me.: Never [place] Postnatal Depression Scale Total: 1  PHQ2-9 Depression Scale:     Discharge Follow-up: Edinburgh score requires follow up?: No Patient was advised of the following resources:: Breastfeeding Support Group, Support Group Declined email information at this time. Post-discharge interventions: Reviewed Newborn Safe Sleep Practices  Signature Allean IVAR Carton, RN, 01/12/24, [phone]

## 2024-02-10 ENCOUNTER — Encounter: Payer: Self-pay | Admitting: Women's Health

## 2024-02-10 ENCOUNTER — Ambulatory Visit (INDEPENDENT_AMBULATORY_CARE_PROVIDER_SITE_OTHER): Admitting: Women's Health

## 2024-02-10 DIAGNOSIS — K5900 Constipation, unspecified: Secondary | ICD-10-CM

## 2024-02-10 DIAGNOSIS — Z30018 Encounter for initial prescription of other contraceptives: Secondary | ICD-10-CM

## 2024-02-10 MED ORDER — PHEXXI 1.8-1-0.4 % VA GEL: 1.0000 | Freq: Once | VAGINAL | 11 refills | Status: AC

## 2024-02-10 NOTE — Patient Instructions (Signed)
Constipation  Drink plenty of fluid, preferably water, throughout the day  Eat foods high in fiber such as fruits, vegetables, and grains  Exercise, such as walking, is a good way to keep your bowels regular  Drink warm fluids, especially warm prune juice, or decaf coffee  Eat a 1/2 cup of real oatmeal (not instant), 1/2 cup applesauce, and 1/2-1 cup warm prune juice every day  If needed, you may take Colace (docusate sodium) stool softener once or twice a day to help keep the stool soft. If you are pregnant, wait until you are out of your first trimester (12-14 weeks of pregnancy)  If you still are having problems with constipation, you may take Miralax once daily as needed to help keep your bowels regular.  If you are pregnant, wait until you are out of your first trimester (12-14 weeks of pregnancy)    

## 2024-02-10 NOTE — Progress Notes (Signed)
 POSTPARTUM VISIT Patient name: Zoe Carroll MRN [id number]  Date of birth: 10-19-2005 Chief Complaint:   Postpartum Care (Vaginal delivery stitches)  History of Present Illness:   Zoe Carroll is a 18 y.o. G34P2002 Caucasian female being seen today for a postpartum visit. She is 5 weeks postpartum following a spontaneous vaginal delivery at 40.6 gestational weeks. IOL: no, for n/a. Anesthesia: epidural.  Laceration: 1st degree.  Complications: none. Inpatient contraception: no.   Pregnancy uncomplicated. Tobacco use: no. Substance use disorder: no. Last pap smear: <21yo and results were N/A. Next pap smear due: @19yo  Patient's last menstrual period was 03/20/2023.  Postpartum course has been uncomplicated. Bleeding none. Bowel function is abnormal: constipation. Bladder function is normal. Urinary incontinence? no, fecal incontinence? no Patient is not sexually active. Last sexual activity: prior to birth of baby. Desired contraception: Phexxi and condoms . Patient does not want a pregnancy in the future.  Desired family size is 2 children.   Upstream - 02/10/24 1147       Pregnancy Intention Screening   Does the patient want to become pregnant in the next year? No    Does the patient's partner want to become pregnant in the next year? No    Would the patient like to discuss contraceptive options today? No      Contraception Wrap Up   Current Method Female Condom    End Method Female Condom    Contraception Counseling Provided No         The pregnancy intention screening data noted above was reviewed. Potential methods of contraception were discussed. The patient elected to proceed with Female Condom.  Edinburgh Postpartum Depression Screening: negative  Edinburgh Postnatal Depression Scale - 02/10/24 1146       Edinburgh Postnatal Depression Scale:  In the Past 7 Days   I have been able to laugh and see the funny side of things. 0    I have looked forward with enjoyment to  things. 0    I have blamed myself unnecessarily when things went wrong. 0    I have been anxious or worried for no good reason. 0    I have felt scared or panicky for no good reason. 0    Things have been getting on top of me. 0    I have been so unhappy that I have had difficulty sleeping. 0    I have felt sad or miserable. 0    I have been so unhappy that I have been crying. 0             06/11/2023    3:32 PM 11/01/2021    4:23 PM  GAD 7 : Generalized Anxiety Score  Nervous, Anxious, on Edge 0 0  Control/stop worrying 0 0  Worry too much - different things 0 0  Trouble relaxing 0 0  Restless 0 0  Easily annoyed or irritable 0 0  Afraid - awful might happen 0 0  Total GAD 7 Score 0 0     Baby's course has been uncomplicated. Baby is feeding by breast and bottle: milk supply adequate. Infant has a pediatrician/family doctor? Yes.  Childcare strategy if returning to work/school: n/a-stay at home mom.  Pt has material needs met for her and baby: Yes.   Review of Systems:   Pertinent items are noted in HPI Denies Abnormal vaginal discharge w/ itching/odor/irritation, headaches, visual changes, shortness of breath, chest pain, abdominal pain, severe nausea/vomiting, or problems with urination or  bowel movements. Pertinent History Reviewed:  Reviewed past medical,surgical, obstetrical and family history.  Reviewed problem list, medications and allergies. OB History  Gravida Para Term Preterm AB Living  2 2 2   2   SAB IAB Ectopic Multiple Live Births     0 2    # Outcome Date GA Lbr Len/2nd Weight Sex Type Anes PTL Lv  2 Term 12/31/23 [redacted]w[redacted]d  9 lb 6.6 oz (4.27 kg) M Vag-Spont   LIV  1 Term 12/01/21 [redacted]w[redacted]d 20:55 / 01:20 8 lb 6.4 oz (3.81 kg) M Vag-Spont EPI N LIV   Physical Assessment:   Vitals:   02/10/24 1136  BP: 109/75  Pulse: 88  Weight: 158 lb (71.7 kg)  Height: 5' 7 (1.702 m)  Body mass index is 24.75 kg/m.       Physical Examination:   General appearance: alert,  well appearing, and in no distress  Mental status: alert, oriented to person, place, and time  Skin: warm & dry   Cardiovascular: normal heart rate noted   Respiratory: normal respiratory effort, no distress   Breasts: deferred, no complaints   Abdomen: soft, non-tender   Pelvic: lac well healed. Thin prep pap obtained: No  Rectal: not examined  Extremities: Edema: none   Chaperone: Peggy Dones       No results found for this or any previous visit (from the past 24 hours).  Assessment & Plan:  1) Postpartum exam 2) 5 wks s/p spontaneous vaginal delivery 3) breast & bottle feeding 4) Depression screening 5) Contraception management: rx Phexxi to [facility], discussed use 6) Constipation> gave printed prevention/relief measures   Essential components of care per ACOG recommendations:  1.  Mood and well being:  If positive depression screen, discussed and plan developed.  If using tobacco we discussed reduction/cessation and risk of relapse If current substance abuse, we discussed and referral to local resources was offered.   2. Infant care and feeding:  If breastfeeding, discussed returning to work, pumping, breastfeeding-associated pain, guidance regarding return to fertility while lactating if not using another method. If needed, patient was provided with a letter to be allowed to pump q 2-3hrs to support lactation in a private location with access to a refrigerator to store breastmilk.   Recommended that all caregivers be immunized for flu, pertussis and other preventable communicable diseases If pt does not have material needs met for her/baby, referred to local resources for help obtaining these.  3. Sexuality, contraception and birth spacing Provided guidance regarding sexuality, management of dyspareunia, and resumption of intercourse Discussed avoiding interpregnancy interval <47mths and recommended birth spacing of 18 months  4. Sleep and fatigue Discussed coping options  for fatigue and sleep disruption Encouraged family/partner/community support of 4 hrs of uninterrupted sleep to help with mood and fatigue  5. Physical recovery  If pt had a C/S, assessed incisional pain and providing guidance on normal vs prolonged recovery If pt had a laceration, perineal healing and pain reviewed.  If urinary or fecal incontinence, discussed management and referred to PT or uro/gyn if indicated  Patient is safe to resume physical activity. Discussed attainment of healthy weight.  6.  Chronic disease management Discussed pregnancy complications if any, and their implications for future childbearing and long-term maternal health. Review recommendations for prevention of recurrent pregnancy complications, such as 17 hydroxyprogesterone caproate to reduce risk for recurrent PTB not applicable, or aspirin to reduce risk of preeclampsia not applicable. Pt had GDM: no. If yes, 2hr GTT scheduled:  not applicable. Reviewed medications and non-pregnant dosing including consideration of whether pt is breastfeeding using a reliable resource such as LactMed: yes Referred for f/u w/ PCP or subspecialist providers as indicated: not applicable  7. Health maintenance Mammogram at 20yo or earlier if indicated Pap smears as indicated  Meds:  Meds ordered this encounter  Medications   Lactic Ac-Citric Ac-Pot Bitart (PHEXXI) 1.8-1-0.4 % GEL    Sig: Place 1 Applicatorful vaginally once for 1 dose. Up to 1 hour before sex    Dispense:  180 g    Refill:  11    Follow-up: Return for 4/20 or after for , Pap & physical.   No orders of the defined types were placed in this encounter.   Suzen JONELLE Fetters CNM, WHNP-BC 02/10/2024 12:00 PM

## 2024-05-05 ENCOUNTER — Ambulatory Visit
Admission: EM | Admit: 2024-05-05 | Discharge: 2024-05-05 | Disposition: A | Discharge status: Home / Self Care | Attending: Family Medicine | Admitting: Family Medicine

## 2024-05-05 DIAGNOSIS — N760 Acute vaginitis: Secondary | ICD-10-CM | POA: Insufficient documentation

## 2024-05-05 DIAGNOSIS — Z20828 Contact with and (suspected) exposure to other viral communicable diseases: Secondary | ICD-10-CM | POA: Insufficient documentation

## 2024-05-05 DIAGNOSIS — Z3202 Encounter for pregnancy test, result negative: Secondary | ICD-10-CM | POA: Diagnosis not present

## 2024-05-05 DIAGNOSIS — J069 Acute upper respiratory infection, unspecified: Secondary | ICD-10-CM | POA: Diagnosis not present

## 2024-05-05 LAB — POCT URINE DIPSTICK
Blood, UA: NEGATIVE
Glucose, UA: NEGATIVE mg/dL
Leukocytes, UA: NEGATIVE
Nitrite, UA: NEGATIVE
POC PROTEIN,UA: >=300 — AB
Spec Grav, UA: >=1.03 — AB
Urobilinogen, UA: >=8 U/dL — AB
pH, UA: 6

## 2024-05-05 LAB — POCT URINE PREGNANCY: Preg Test, Ur: NEGATIVE

## 2024-05-05 MED ORDER — PROMETHAZINE-DM 6.25-15 MG/5ML PO SYRP: 5.0000 mL | ORAL_SOLUTION | Freq: Four times a day (QID) | ORAL | 0 refills | Status: AC | PRN

## 2024-05-05 MED ORDER — AZELASTINE HCL 0.1 % NA SOLN: 1.0000 | Freq: Two times a day (BID) | NASAL | 0 refills | Status: AC

## 2024-05-05 MED ORDER — CETIRIZINE HCL 10 MG PO TABS: 10.0000 mg | ORAL_TABLET | Freq: Every day | ORAL | 0 refills | Status: AC

## 2024-05-05 NOTE — ED Triage Notes (Addendum)
 Pt states cough for the past 5 days,boy friend tested positive for fluA.  States she has been taking Oscillococcinum at home and after taking it her face turned red and is swollen.  States also for the past 2 weeks she has burning with urination and vaginal irritation with creamy  watery discharge. Pt also thinks she might be pregnant,requesting a pregnancy test.

## 2024-05-05 NOTE — Discharge Instructions (Signed)
 I have sent in some medications to help with your upper respiratory symptoms, likely caused by the flu given your close exposure.  I have also sent in some Zyrtec for your facial redness and swelling.  Your urinalysis today did not show a urinary tract infection and your urine pregnancy was negative.  We have sent out a vaginal swab and we will let you know if anything comes up positive on this.  Follow-up for worsening or unresolving symptoms.

## 2024-05-06 NOTE — ED Provider Notes (Addendum)
 " [facility]    CSN: [id number] Arrival date & time: 05/05/24  1702      History   Chief Complaint Chief Complaint  Patient presents with   Cough    HPI Zoe Carroll is a 19 y.o. female.   Patient presenting today with 5-day history of cough, runny nose, fatigue, body aches.  States boyfriend just tested positive for influenza A and she believes that she may have this.  She has been taking over-the-counter supplements and remedies with minimal relief and thinks she may have had a reaction to one of the over-the-counter remedies as her face became red and swollen.  Denies throat itching or swelling, chest tightness, shortness of breath, abdominal pain, nausea, vomiting related to this.  So far not trying anything over-the-counter for the reaction.  Also having about 2 weeks of dysuria, vaginal irritation and watery discharge.  Requesting a pregnancy test as she thinks she may be pregnant.  She is currently breast-feeding and not having regular menstrual cycles.    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 Authorizing Provider  azelastine (ASTELIN) 0.1 % nasal spray Place 1 spray into both nostrils 2 (two) times daily. Use in each nostril as directed 05/05/24  Yes Stuart Vernell Norris, PA-C  cetirizine (ZYRTEC ALLERGY) 10 MG tablet Take 1 tablet (10 mg total) by  mouth daily. 05/05/24  Yes Stuart Vernell Norris, PA-C  promethazine -dextromethorphan (PROMETHAZINE -DM) 6.25-15 MG/5ML syrup Take 5 mLs by mouth 4 (four) times daily as needed. 05/05/24  Yes Stuart Vernell Norris, PA-C  ibuprofen  (ADVIL ) 800 MG tablet Take 1 tablet (800 mg total) by mouth every 8 (eight) hours. Patient not taking: Reported on 02/10/2024 01/01/24   Cashion, Colter L, MD  prenatal vitamin w/FE, FA (PRENATAL 1 + 1) 27-1 MG TABS tablet Take 1 tablet by mouth daily at 12 noon. Patient not taking: Reported on 02/10/2024 04/22/23   Signa Nest A, NP  senna-docusate (SENOKOT-S) 8.6-50 MG tablet Take 2 tablets by mouth daily. Patient not taking: Reported on 02/10/2024 01/01/24   Cashion, Barkley CROME, MD    Family History Family History  Problem Relation Age of Onset   Lupus Mother    Colitis Mother    Muscular dystrophy Mother    COPD Father    Stroke Maternal Grandmother    Lung cancer Paternal Grandmother    Skin cancer Paternal Grandmother    Heart attack Paternal Grandfather     Social History Social History[1]   Allergies   Amoxicillin and Penicillin g   Review of Systems Review of Systems  Per HPI  Physical Exam Triage Vital Signs ED Triage Vitals [05/05/24 1744]  Encounter Vitals Group     BP 115/75     Girls Systolic BP Percentile      Girls Diastolic BP Percentile      Boys Systolic BP Percentile      Boys Diastolic BP Percentile      Pulse Rate 93     Resp 16     Temp 99 F (37.2 C)     Temp Source Oral     SpO2 97 %     Weight      Height      Head Circumference      Peak Flow      Pain Score      Pain Loc      Pain Education      Exclude from Growth Chart    No data found.  Updated Vital Signs BP 115/75 (BP Location: Right Arm)   Pulse 93   Temp 99 F (37.2 C) (Oral)   Resp 16   SpO2 97%   Breastfeeding Yes   Visual Acuity Right Eye Distance:   Left Eye Distance:   Bilateral Distance:    Right Eye Near:   Left Eye Near:     Bilateral Near:     Physical Exam Vitals and nursing note reviewed.  Constitutional:      Appearance: Normal appearance.  HENT:     Head: Atraumatic.     Right Ear: Tympanic membrane and external ear normal.     Left Ear: Tympanic membrane and external ear normal.     Nose: Rhinorrhea present.     Mouth/Throat:     Mouth: Mucous membranes are moist.     Pharynx: Posterior oropharyngeal erythema present.  Eyes:     Extraocular Movements: Extraocular movements intact.     Conjunctiva/sclera: Conjunctivae normal.  Cardiovascular:     Rate and Rhythm: Normal rate and regular rhythm.     Heart sounds: Normal heart sounds.  Pulmonary:     Effort: Pulmonary effort is normal.     Breath sounds: Normal breath sounds. No wheezing.  Abdominal:     General: Bowel sounds are normal. There is no distension.     Palpations: Abdomen is soft.     Tenderness: There is no abdominal tenderness. There is no right CVA tenderness, left CVA tenderness or guarding.  Musculoskeletal:        General: Normal range of motion.     Cervical back: Normal range of motion and neck supple.  Skin:    General: Skin is warm and dry.  Neurological:     Mental Status: She is alert and oriented to person, place, and time.  Psychiatric:        Mood and Affect: Mood normal.        Thought Content: Thought content normal.      UC Treatments / Results  Labs (all labs ordered are listed, but only abnormal results are displayed) Labs Reviewed  POCT URINE DIPSTICK - Abnormal; Notable for the following components:      Result Value   Bilirubin, UA small (*)    Ketones, POC UA trace (5) (*)    Spec Grav, UA >=1.030 (*)    POC PROTEIN,UA >=300 (*)    Urobilinogen, UA >=8.0 (*)    All other components within normal limits  POCT URINE PREGNANCY  CERVICOVAGINAL ANCILLARY ONLY    EKG   Radiology No results  found.  Procedures Procedures (including critical care time)  Medications Ordered in  UC Medications - No data to display  Initial Impression / Assessment and Plan / UC Course  I have reviewed the triage vital signs and the nursing notes.  Pertinent labs & imaging results that were available during my care of the patient were reviewed by me and considered in my medical decision making (see chart for details).     Vital signs and exam reassuring today, suspect viral respiratory infection to be causing her respiratory symptoms.  Treat with Phenergan  DM, Astelin, and start Zyrtec for possible reaction to the over-the-counter natural remedy that she took.  No evidence of anaphylaxis or more severe reaction at this time.  Negative urine pregnancy, urinalysis evidence of urinary tract infection.  Vaginal swab pending.  Return for worsening or unresolving symptoms.  Final Clinical Impressions(s) / UC Diagnoses   Final diagnoses:  Viral URI with cough  Exposure to influenza  Acute vaginitis  Negative pregnancy test     Discharge Instructions      I have sent in some medications to help with your upper respiratory symptoms, likely caused by the flu given your close exposure.  I have also sent in some Zyrtec for your facial redness and swelling.  Your urinalysis today did not show a urinary tract infection and your urine pregnancy was negative.  We have sent out a vaginal swab and we will let you know if anything comes up positive on this.  Follow-up for worsening or unresolving symptoms.    ED Prescriptions     Medication Sig Dispense Auth. Provider   promethazine -dextromethorphan (PROMETHAZINE -DM) 6.25-15 MG/5ML syrup Take 5 mLs by mouth 4 (four) times daily as needed. 100 mL Stuart Vernell Norris, PA-C   azelastine (ASTELIN) 0.1 % nasal spray Place 1 spray into both nostrils 2 (two) times daily. Use in each nostril as directed 30 mL Stuart Vernell Norris, PA-C   cetirizine (ZYRTEC ALLERGY) 10 MG tablet Take 1 tablet (10 mg total) by mouth daily. 30 tablet Stuart Vernell Norris, [place]      PDMP not reviewed this encounter.    Stuart Vernell Norris, [place] 05/06/24 [id number]     [1]  Social History Tobacco Use   Smoking status: Never   Smokeless tobacco: Never  Vaping Use   Vaping status: Never Used  Substance Use Topics   Alcohol use: Never   Drug use: Never     Stuart Vernell Norris, PA-C 05/06/24 [phone]  "

## 2024-05-07 ENCOUNTER — Ambulatory Visit (HOSPITAL_COMMUNITY): Payer: Self-pay

## 2024-05-07 LAB — CERVICOVAGINAL ANCILLARY ONLY
Bacterial Vaginitis (gardnerella): NEGATIVE
Candida Glabrata: NEGATIVE
Candida Vaginitis: POSITIVE — AB
Comment: NEGATIVE
Comment: NEGATIVE
Comment: NEGATIVE

## 2024-05-07 MED ORDER — FLUCONAZOLE 150 MG PO TABS: 150.0000 mg | ORAL_TABLET | Freq: Once | ORAL | 0 refills | Status: AC
